# Patient Record
Sex: Female | Born: 1975 | Race: Black or African American | Hispanic: No | Marital: Single | Smoking: Never smoker
Health system: Southern US, Community
[De-identification: ages and names within clinical notes are randomized; demographics above are authoritative.]

## PROBLEM LIST (undated history)

## (undated) DIAGNOSIS — J40 Bronchitis, not specified as acute or chronic: Secondary | ICD-10-CM

## (undated) DIAGNOSIS — T7840XA Allergy, unspecified, initial encounter: Secondary | ICD-10-CM

## (undated) HISTORY — DX: Allergy, unspecified, initial encounter: T78.40XA

---

## 2006-06-05 ENCOUNTER — Emergency Department (HOSPITAL_COMMUNITY): Admission: EM | Admit: 2006-06-05 | Discharge: 2006-06-05 | Payer: Self-pay | Admitting: Emergency Medicine

## 2006-06-23 ENCOUNTER — Emergency Department (HOSPITAL_COMMUNITY): Admission: EM | Admit: 2006-06-23 | Discharge: 2006-06-23 | Payer: Self-pay | Admitting: Emergency Medicine

## 2006-07-08 ENCOUNTER — Emergency Department (HOSPITAL_COMMUNITY): Admission: EM | Admit: 2006-07-08 | Discharge: 2006-07-08 | Payer: Self-pay | Admitting: Emergency Medicine

## 2008-02-20 ENCOUNTER — Emergency Department (HOSPITAL_COMMUNITY): Admission: EM | Admit: 2008-02-20 | Discharge: 2008-02-20 | Payer: Self-pay | Admitting: Emergency Medicine

## 2010-02-15 ENCOUNTER — Emergency Department (HOSPITAL_COMMUNITY): Admission: EM | Admit: 2010-02-15 | Discharge: 2010-02-15 | Payer: Self-pay | Admitting: Gastroenterology

## 2011-05-20 ENCOUNTER — Emergency Department (HOSPITAL_COMMUNITY)
Admission: EM | Admit: 2011-05-20 | Discharge: 2011-05-20 | Disposition: A | Discharge status: Home / Self Care | Payer: Medicaid Other | Attending: Emergency Medicine | Admitting: Emergency Medicine

## 2011-05-20 ENCOUNTER — Encounter: Payer: Self-pay | Admitting: *Deleted

## 2011-05-20 DIAGNOSIS — J020 Streptococcal pharyngitis: Secondary | ICD-10-CM | POA: Insufficient documentation

## 2011-05-20 DIAGNOSIS — J45909 Unspecified asthma, uncomplicated: Secondary | ICD-10-CM | POA: Insufficient documentation

## 2011-05-20 LAB — RAPID STREP SCREEN (MED CTR MEBANE ONLY): Streptococcus, Group A Screen (Direct): POSITIVE — AB

## 2011-05-20 MED ORDER — HYDROCODONE-ACETAMINOPHEN 5-325 MG PO TABS
1.0000 | ORAL_TABLET | Freq: Once | ORAL | Status: AC
  Administered 2011-05-20: 1 via ORAL
  Filled 2011-05-20: qty 1

## 2011-05-20 MED ORDER — PENICILLIN V POTASSIUM 500 MG PO TABS: 500.0000 mg | ORAL_TABLET | Freq: Four times a day (QID) | ORAL | Status: AC

## 2011-05-20 MED ORDER — PENICILLIN V POTASSIUM 250 MG PO TABS
500.0000 mg | ORAL_TABLET | Freq: Once | ORAL | Status: AC
  Administered 2011-05-20: 500 mg via ORAL
  Filled 2011-05-20: qty 2

## 2011-05-20 MED ORDER — HYDROCODONE-ACETAMINOPHEN 5-325 MG PO TABS: 1.0000 | ORAL_TABLET | ORAL | Status: AC | PRN

## 2011-05-20 NOTE — ED Provider Notes (Signed)
History   This chart was scribed for [employer]. Colon Branch, MD by Sofie Rower. The patient was seen in room APA06/APA06 and the patient's care was started at 8:21AM.  CSN: [id number] Arrival date & time: 05/20/2011  8:03 AM   First MD Initiated Contact with Patient 05/20/11 [phone]      Chief Complaint  Patient presents with  . Sore Throat    (Consider location/radiation/quality/duration/timing/severity/associated sxs/prior treatment) HPI  Linda Carroll is a 35 y.o. female who presents to the Emergency Department complaining of moderate, constant sore throat onset this morning with associated symptoms of loss of appetite, dysphagia. Pt. Denies chills, vomiting. Pt. Is not a smoker. Pt. Denies denies recent sick contacts.    Past Medical History  Diagnosis Date  . Asthma     History reviewed. No pertinent past surgical history.  History reviewed. No pertinent family history.  History  Substance Use Topics  . Smoking status: Never Smoker   . Smokeless tobacco: Not on file  . Alcohol Use: No    OB History    Grav Para Term Preterm Abortions TAB SAB Ect Mult Living                  Review of Systems  10 Systems reviewed and are negative for acute change except as noted in the HPI.  Allergies  Review of patient's allergies indicates no known allergies.  Home Medications   Current Outpatient Rx  Name Route Sig Dispense Refill  . ALBUTEROL SULFATE HFA 108 (90 BASE) MCG/ACT IN AERS Inhalation Inhale 2 puffs into the lungs every 6 (six) hours as needed. Shortness of breath     . FLUTICASONE-SALMETEROL 250-50 MCG/DOSE IN AEPB Inhalation Inhale 1 puff into the lungs every 12 (twelve) hours as needed. Shortness of breath       BP 135/76  Pulse 95  Temp(Src) 98.7 F (37.1 C) (Oral)  Resp 18  Ht 5\' 3"  (1.6 m)  Wt 165 lb (74.844 kg)  BMI 29.23 kg/m2  SpO2 100%  LMP 05/16/2011  Physical Exam  Nursing note and vitals reviewed. Constitutional: She is oriented to person, place,  and time. She appears well-developed and well-nourished. No distress.  HENT:  Head: Normocephalic and atraumatic.  Right Ear: Tympanic membrane and external ear normal.  Left Ear: Tympanic membrane and external ear normal.  Mouth/Throat: Oropharynx is clear and moist. No oropharyngeal exudate.       Slightly erythematous tonsils. Slightly enlarged tonsils. No exudate.   Eyes: EOM are normal. Pupils are equal, round, and reactive to light.  Neck: Normal range of motion. Neck supple. No tracheal deviation present.  Cardiovascular: Normal rate, regular rhythm and normal heart sounds.  Exam reveals no gallop and no friction rub.   No murmur heard. Pulmonary/Chest: Effort normal and breath sounds normal. No respiratory distress. She has no wheezes. She has no rales.  Abdominal: Soft. She exhibits no distension. There is no tenderness.  Musculoskeletal: Normal range of motion. She exhibits no edema.  Lymphadenopathy:    She has no cervical adenopathy.  Neurological: She is alert and oriented to person, place, and time. No sensory deficit.  Skin: Skin is warm and dry.  Psychiatric: She has a normal mood and affect. Her behavior is normal.    ED Course  Procedures (including critical care time)  Labs Reviewed  RAPID STREP SCREEN - Abnormal; Notable for the following:    Streptococcus, Group A Screen (Direct) POSITIVE (*)    All other components  within normal limits   No results found.   No diagnosis found.  DIAGNOSTIC STUDIES: Oxygen Saturation is 100% on room air, normal by my interpretation.    COORDINATION OF CARE: 8:28AM- EDP at bedside discusses treatment plan. 9:31AM-Recheck. EDP discusses treatment plan for strep throat and discharge.   Results for orders placed during the hospital encounter of 05/20/11  RAPID STREP SCREEN      Component Value Range   Streptococcus, Group A Screen (Direct) POSITIVE (*) NEGATIVE      MDM  Patient with sore throat that began this  morning. Strep positive. Initiated antibiotic treatment.Pt stable in ED with no significant deterioration in condition.The patient appears reasonably screened and/or stabilized for discharge and I doubt any other medical condition or other [facility] requiring further screening, evaluation, or treatment in the ED at this time prior to discharge.   I personally performed the services described in this documentation, which was scribed in my presence. The recorded information has been reviewed and considered.  MDM Reviewed: nursing note and vitals Interpretation: labs     Nicoletta Dress. Colon Branch, MD 05/20/11 [phone]

## 2011-05-20 NOTE — ED Notes (Signed)
Pt c/o sore throat and states she can't eat or sleep d/t pain. Pt describes pain as sharp.

## 2011-05-20 NOTE — ED Notes (Signed)
Pt c/o cough and sore throat. nad noted.

## 2013-03-12 ENCOUNTER — Encounter (HOSPITAL_COMMUNITY): Payer: Self-pay | Admitting: Emergency Medicine

## 2013-03-12 ENCOUNTER — Emergency Department (HOSPITAL_COMMUNITY)
Admission: EM | Admit: 2013-03-12 | Discharge: 2013-03-12 | Disposition: A | Discharge status: Home / Self Care | Payer: Medicaid Other | Attending: Emergency Medicine | Admitting: Emergency Medicine

## 2013-03-12 DIAGNOSIS — J45901 Unspecified asthma with (acute) exacerbation: Secondary | ICD-10-CM | POA: Insufficient documentation

## 2013-03-12 MED ORDER — ALBUTEROL SULFATE HFA 108 (90 BASE) MCG/ACT IN AERS: 4.0000 | INHALATION_SPRAY | RESPIRATORY_TRACT | Status: DC | PRN

## 2013-03-12 MED ORDER — ALBUTEROL SULFATE HFA 108 (90 BASE) MCG/ACT IN AERS
4.0000 | INHALATION_SPRAY | RESPIRATORY_TRACT | Status: DC | PRN
  Administered 2013-03-12: 4 via RESPIRATORY_TRACT
  Filled 2013-03-12: qty 6.7

## 2013-03-12 MED ORDER — ALBUTEROL SULFATE (5 MG/ML) 0.5% IN NEBU
2.5000 mg | INHALATION_SOLUTION | RESPIRATORY_TRACT | Status: DC
  Administered 2013-03-12: 2.5 mg via RESPIRATORY_TRACT
  Filled 2013-03-12: qty 0.5

## 2013-03-12 MED ORDER — DEXAMETHASONE 6 MG PO TABS
10.0000 mg | ORAL_TABLET | Freq: Once | ORAL | Status: DC
  Filled 2013-03-12: qty 1

## 2013-03-12 MED ORDER — IPRATROPIUM BROMIDE 0.02 % IN SOLN
0.5000 mg | RESPIRATORY_TRACT | Status: DC
  Administered 2013-03-12: 0.5 mg via RESPIRATORY_TRACT
  Filled 2013-03-12: qty 2.5

## 2013-03-12 MED ORDER — DEXAMETHASONE SODIUM PHOSPHATE 4 MG/ML IJ SOLN
10.0000 mg | Freq: Once | INTRAMUSCULAR | Status: AC
  Administered 2013-03-12: 10 mg via INTRAMUSCULAR
  Filled 2013-03-12: qty 3

## 2013-03-12 NOTE — ED Notes (Signed)
Patient c/o increased wheezing and shortness of breath secondary to asthma.  States has run out of her MDI.

## 2013-03-12 NOTE — ED Provider Notes (Signed)
CSN: [id number]     Arrival date & time 03/12/13  2046 History   First MD Initiated Contact with Patient 03/12/13 2053     Chief Complaint  Patient presents with  . Asthma  . Wheezing   (Consider location/radiation/quality/duration/timing/severity/associated sxs/prior Treatment) Patient is a 37 y.o. female presenting with asthma and wheezing.  Asthma Associated symptoms include shortness of breath. Pertinent negatives include no chest pain.  Wheezing Associated symptoms: cough and shortness of breath   Associated symptoms: no chest pain, no chest tightness and no fever    This is a 37 yo female with history of asthma who presents with wheezing. Patient reports onset of symptoms tonight. She states that weather triggers her asthma.  She has run out of her inhaler. She is unsure when her last exacerbation was. She's never had to be hospitalized or intubated for her asthma. She denies any fevers. Past Medical History  Diagnosis Date  . Asthma    History reviewed. No pertinent past surgical history. No family history on file. History  Substance Use Topics  . Smoking status: Never Smoker   . Smokeless tobacco: Not on file  . Alcohol Use: No   OB History   Grav Para Term Preterm Abortions TAB SAB Ect Mult Living                 Review of Systems  Constitutional: Negative for fever.  Respiratory: Positive for cough, shortness of breath and wheezing. Negative for chest tightness.   Cardiovascular: Negative for chest pain.    Allergies  Review of patient's allergies indicates no known allergies.  Home Medications   Current Outpatient Rx  Name  Route  Sig  Dispense  Refill  . albuterol (PROVENTIL HFA;VENTOLIN HFA) 108 (90 BASE) MCG/ACT inhaler   Inhalation   Inhale 4 puffs into the lungs every 4 (four) hours as needed for wheezing or shortness of breath.   1 Inhaler   0    BP 104/50  Pulse 89  Temp(Src) 99.2 F (37.3 C) (Oral)  Resp 18  Wt 165 lb (74.844 kg)  BMI  29.24 kg/m2  SpO2 100%  LMP 03/05/2013 Physical Exam  Nursing note and vitals reviewed. Constitutional: She is oriented to person, place, and time. She appears well-developed and well-nourished. No distress.  HENT:  Head: Normocephalic and atraumatic.  Eyes: Pupils are equal, round, and reactive to light.  Neck: Neck supple.  Cardiovascular: Normal rate, regular rhythm and normal heart sounds.   Pulmonary/Chest: No respiratory distress. She has wheezes.  Patient speaking in short sentences. Increased respiratory rate, diffuse expiratory wheezing  Abdominal: Soft. There is no tenderness.  Neurological: She is alert and oriented to person, place, and time.  Skin: Skin is warm and dry.  Psychiatric: She has a normal mood and affect.    ED Course  Procedures (including critical care time) Labs Review Labs Reviewed - No data to display Imaging Review No results found.  MDM   1. Asthma exacerbation    Patient presents with acute asthma exacerbation.  Nontoxic but tachypnic.  100% O2 sats.  Given decadron, neb and HFA with improvement but still intermittent wheezing.  Patient to be reassessed by Dr. Manus Gunning prior to d/c.    Shon Baton, MD 03/13/13 [phone]

## 2013-05-16 ENCOUNTER — Encounter (HOSPITAL_COMMUNITY): Payer: Self-pay | Admitting: Emergency Medicine

## 2013-05-16 ENCOUNTER — Emergency Department (HOSPITAL_COMMUNITY)
Admission: EM | Admit: 2013-05-16 | Discharge: 2013-05-16 | Disposition: A | Discharge status: Home / Self Care | Payer: Medicaid Other | Attending: Emergency Medicine | Admitting: Emergency Medicine

## 2013-05-16 ENCOUNTER — Emergency Department (HOSPITAL_COMMUNITY): Payer: Medicaid Other

## 2013-05-16 DIAGNOSIS — Z3202 Encounter for pregnancy test, result negative: Secondary | ICD-10-CM | POA: Insufficient documentation

## 2013-05-16 DIAGNOSIS — J159 Unspecified bacterial pneumonia: Secondary | ICD-10-CM | POA: Insufficient documentation

## 2013-05-16 DIAGNOSIS — Z79899 Other long term (current) drug therapy: Secondary | ICD-10-CM | POA: Insufficient documentation

## 2013-05-16 DIAGNOSIS — J189 Pneumonia, unspecified organism: Secondary | ICD-10-CM

## 2013-05-16 HISTORY — DX: Bronchitis, not specified as acute or chronic: J40

## 2013-05-16 MED ORDER — HYDROCOD POLST-CHLORPHEN POLST 10-8 MG/5ML PO LQCR
5.0000 mL | Freq: Once | ORAL | Status: AC
  Administered 2013-05-16: 5 mL via ORAL
  Filled 2013-05-16: qty 5

## 2013-05-16 MED ORDER — ALBUTEROL SULFATE HFA 108 (90 BASE) MCG/ACT IN AERS
2.0000 | INHALATION_SPRAY | Freq: Once | RESPIRATORY_TRACT | Status: AC
  Administered 2013-05-16: 2 via RESPIRATORY_TRACT
  Filled 2013-05-16: qty 6.7

## 2013-05-16 MED ORDER — PREDNISONE 50 MG PO TABS
60.0000 mg | ORAL_TABLET | Freq: Once | ORAL | Status: AC
  Administered 2013-05-16: 60 mg via ORAL
  Filled 2013-05-16 (×2): qty 1

## 2013-05-16 MED ORDER — LIDOCAINE HCL (PF) 1 % IJ SOLN
INTRAMUSCULAR | Status: AC
  Administered 2013-05-16: 2.1 mL
  Filled 2013-05-16: qty 5

## 2013-05-16 MED ORDER — AZITHROMYCIN 250 MG PO TABS
500.0000 mg | ORAL_TABLET | Freq: Once | ORAL | Status: AC
  Administered 2013-05-16: 500 mg via ORAL
  Filled 2013-05-16: qty 2

## 2013-05-16 MED ORDER — SODIUM CHLORIDE 0.9 % IN NEBU
INHALATION_SOLUTION | RESPIRATORY_TRACT | Status: AC
  Administered 2013-05-16: 18:00:00
  Filled 2013-05-16: qty 3

## 2013-05-16 MED ORDER — PHENYLEPH-DIPHENHYD-CODEINE 7.5-10-7.5 MG/5ML PO SYRP: ORAL_SOLUTION | ORAL | Status: DC

## 2013-05-16 MED ORDER — ALBUTEROL SULFATE (5 MG/ML) 0.5% IN NEBU
2.5000 mg | INHALATION_SOLUTION | Freq: Once | RESPIRATORY_TRACT | Status: AC
  Administered 2013-05-16: 2.5 mg via RESPIRATORY_TRACT
  Filled 2013-05-16: qty 0.5

## 2013-05-16 MED ORDER — ALBUTEROL SULFATE (5 MG/ML) 0.5% IN NEBU
5.0000 mg | INHALATION_SOLUTION | Freq: Once | RESPIRATORY_TRACT | Status: AC
  Administered 2013-05-16: 5 mg via RESPIRATORY_TRACT
  Filled 2013-05-16: qty 1

## 2013-05-16 MED ORDER — AZITHROMYCIN 250 MG PO TABS: ORAL_TABLET | ORAL | Status: DC

## 2013-05-16 MED ORDER — CEFTRIAXONE SODIUM 1 G IJ SOLR
1.0000 g | Freq: Once | INTRAMUSCULAR | Status: AC
  Administered 2013-05-16: 1 g via INTRAMUSCULAR
  Filled 2013-05-16: qty 10

## 2013-05-16 MED ORDER — PREDNISONE 10 MG PO TABS: ORAL_TABLET | ORAL | Status: DC

## 2013-05-16 NOTE — ED Notes (Signed)
Coughing and sob starting today.  Reports hx of asthma and ran out of inhaler.

## 2013-05-16 NOTE — ED Notes (Signed)
Pt seen and evaluated by EDPa for initial assessment. 

## 2013-05-16 NOTE — ED Provider Notes (Signed)
CSN: [id number]     Arrival date & time 05/16/13  1724 History   First MD Initiated Contact with Patient 05/16/13 1742     Chief Complaint  Patient presents with  . Asthma   (Consider location/radiation/quality/duration/timing/severity/associated sxs/prior Treatment) HPI Comments: Patient states that late last night and early this morning she began having problems with cough and congestion. The symptoms were accompanied by shortness of breath. The patient states she has a history of asthma, she tried her albuterol inhaler, but this ran out in the middle of night. She states the cough congestion and shortness of breath has gotten progressively worse during the day. She came to the emergency department because she says that walking even short distances causes her more shortness of breath, and the coughing has gotten more severe. There's been no high fevers reported. No hemoptysis. No injury to the chest. No history of operations or procedures on the lungs. The patient does not smoke.  Patient is a 37 y.o. female presenting with shortness of breath. The history is provided by the patient.  Shortness of Breath Severity:  Moderate Onset quality:  Gradual Duration:  1 day Timing:  Constant Progression:  Worsening Context: URI and weather changes   Relieved by:  Nothing Worsened by:  Nothing tried Ineffective treatments:  Inhaler Associated symptoms: cough and wheezing   Associated symptoms: no abdominal pain, no chest pain, no hemoptysis, no neck pain and no vomiting   Risk factors: no recent alcohol use and no tobacco use     Past Medical History  Diagnosis Date  . Asthma   . Bronchitis    History reviewed. No pertinent past surgical history. No family history on file. History  Substance Use Topics  . Smoking status: Never Smoker   . Smokeless tobacco: Not on file  . Alcohol Use: No   OB History   Grav Para Term Preterm Abortions TAB SAB Ect Mult Living                 Review  of Systems  Constitutional: Negative for activity change.       All ROS Neg except as noted in HPI  HENT: Positive for congestion. Negative for nosebleeds.   Eyes: Negative for photophobia and discharge.  Respiratory: Positive for cough and wheezing. Negative for hemoptysis and shortness of breath.   Cardiovascular: Negative for chest pain and palpitations.  Gastrointestinal: Negative for vomiting, abdominal pain and blood in stool.  Genitourinary: Negative for dysuria, frequency and hematuria.  Musculoskeletal: Negative for arthralgias, back pain and neck pain.  Skin: Negative.   Neurological: Negative for dizziness, seizures and speech difficulty.  Psychiatric/Behavioral: Negative for hallucinations and confusion.    Allergies  Peanuts  Home Medications   Current Outpatient Rx  Name  Route  Sig  Dispense  Refill  . albuterol (PROVENTIL HFA;VENTOLIN HFA) 108 (90 BASE) MCG/ACT inhaler   Inhalation   Inhale 4 puffs into the lungs every 4 (four) hours as needed for wheezing or shortness of breath.   1 Inhaler   0    BP 150/88  Pulse 99  Temp(Src) 98.8 F (37.1 C) (Oral)  Resp 18  Ht 5\' 4"  (1.626 m)  SpO2 100%  LMP 03/16/2013 Physical Exam  Nursing note and vitals reviewed. Constitutional: She is oriented to person, place, and time. She appears well-developed and well-nourished.  Non-toxic appearance.  HENT:  Head: Normocephalic.  Right Ear: Tympanic membrane and external ear normal.  Left Ear: Tympanic membrane and external  ear normal.  Eyes: EOM and lids are normal. Pupils are equal, round, and reactive to light.  Neck: Normal range of motion. Neck supple. Carotid bruit is not present.  Cardiovascular: Normal rate, regular rhythm, normal heart sounds, intact distal pulses and normal pulses.   Pulmonary/Chest: Breath sounds normal. No respiratory distress.  There are bilateral rhonchi present. There are coarse expiratory wheezes present. The patient occasionally uses  sensory muscles for breathing. Speech is rushed.  Abdominal: Soft. Bowel sounds are normal. There is no tenderness. There is no guarding.  Musculoskeletal: Normal range of motion.  Lymphadenopathy:       Head (right side): No submandibular adenopathy present.       Head (left side): No submandibular adenopathy present.    She has no cervical adenopathy.  Neurological: She is alert and oriented to person, place, and time. She has normal strength. No cranial nerve deficit or sensory deficit.  Skin: Skin is warm and dry.  Psychiatric: She has a normal mood and affect. Her speech is normal.    ED Course  Procedures (including critical care time) Labs Review Labs Reviewed - No data to display Imaging Review No results found.  EKG Interpretation   None       MDM  No diagnosis found. *I have reviewed nursing notes, vital signs, and all appropriate lab and imaging results for this patient.**  Chest x-ray reveals medial right lung base pneumonia.  After 2 nebulizer treatments, the patient is now speaking in complete sentences. She is no longer using accessory muscles for assistance with breathing. Pulse oximetries remained stable.  The patient is treated with oral Zithromax, and intramuscular Rocephin in the emergency department. Prescription for Zithromax, prednisone, and Endal cough medication given to the patient. Patient given an inhaler here in the emergency department. Patient is to see her primary physician in 4-5 days for recheck and evaluation of her pneumonia.  Kathie Dike, PA-C 05/16/13 [phone]

## 2013-05-17 NOTE — ED Notes (Signed)
Pharmacy did not have cough rx med per [facility]. rx cough med changed per dr Rufina Falco to PromethazineVC codeine

## 2013-05-17 NOTE — ED Provider Notes (Signed)
Medical screening examination/treatment/procedure(s) were performed by non-physician practitioner and as supervising physician I was immediately available for consultation/collaboration.  EKG Interpretation   None        Donnetta Hutching, MD 05/17/13 0005

## 2013-06-07 ENCOUNTER — Emergency Department (HOSPITAL_COMMUNITY): Payer: Medicaid Other

## 2013-06-07 ENCOUNTER — Emergency Department (HOSPITAL_COMMUNITY)
Admission: EM | Admit: 2013-06-07 | Discharge: 2013-06-07 | Disposition: A | Discharge status: Home / Self Care | Payer: Medicaid Other | Attending: Emergency Medicine | Admitting: Emergency Medicine

## 2013-06-07 DIAGNOSIS — J45901 Unspecified asthma with (acute) exacerbation: Secondary | ICD-10-CM | POA: Insufficient documentation

## 2013-06-07 DIAGNOSIS — Z79899 Other long term (current) drug therapy: Secondary | ICD-10-CM | POA: Insufficient documentation

## 2013-06-07 MED ORDER — ALBUTEROL SULFATE HFA 108 (90 BASE) MCG/ACT IN AERS
2.0000 | INHALATION_SPRAY | RESPIRATORY_TRACT | Status: AC
  Administered 2013-06-07: 2 via RESPIRATORY_TRACT
  Filled 2013-06-07: qty 6.7

## 2013-06-07 MED ORDER — ALBUTEROL SULFATE (5 MG/ML) 0.5% IN NEBU
5.0000 mg | INHALATION_SOLUTION | Freq: Once | RESPIRATORY_TRACT | Status: AC
  Administered 2013-06-07: 5 mg via RESPIRATORY_TRACT
  Filled 2013-06-07: qty 1

## 2013-06-07 MED ORDER — PREDNISONE 50 MG PO TABS
60.0000 mg | ORAL_TABLET | Freq: Once | ORAL | Status: AC
  Administered 2013-06-07: 60 mg via ORAL
  Filled 2013-06-07 (×2): qty 1

## 2013-06-07 MED ORDER — PREDNISONE 20 MG PO TABS: 40.0000 mg | ORAL_TABLET | Freq: Every day | ORAL | Status: DC

## 2013-06-07 MED ORDER — BENZONATATE 100 MG PO CAPS: 100.0000 mg | ORAL_CAPSULE | Freq: Three times a day (TID) | ORAL | Status: DC | PRN

## 2013-06-07 MED ORDER — IPRATROPIUM BROMIDE 0.02 % IN SOLN
0.5000 mg | Freq: Once | RESPIRATORY_TRACT | Status: DC
  Filled 2013-06-07: qty 2.5

## 2013-06-07 NOTE — ED Notes (Signed)
Patient reports: -cough and shortness of breath starting this AM -she has a HX of asthma

## 2013-06-07 NOTE — ED Provider Notes (Signed)
CSN: [id number]     Arrival date & time 06/07/13  1051 History   First MD Initiated Contact with Patient 06/07/13 1313     Chief Complaint  Patient presents with  . Shortness of Breath  . Cough    HPI Pt was seen at 1405.  Per pt, c/o gradual onset and worsening of persistent cough, wheezing and SOB since this morning.  Describes her symptoms as "my asthma is acting up."  Has been using home MDI with transient relief.  States she "ran out" of her MDI this morning after using it. Denies CP/palpitations, no back pain, no abd pain, no N/V/D, no fevers, no rash.     Past Medical History  Diagnosis Date  . Asthma   . Bronchitis    No past surgical history on file.  History  Substance Use Topics  . Smoking status: Never Smoker   . Smokeless tobacco: Not on file  . Alcohol Use: No    Review of Systems ROS: Statement: All systems negative except as marked or noted in the HPI; Constitutional: Negative for fever and chills. ; ; Eyes: Negative for eye pain, redness and discharge. ; ; ENMT: Negative for ear pain, hoarseness, nasal congestion, sinus pressure and sore throat. ; ; Cardiovascular: Negative for chest pain, palpitations, diaphoresis and peripheral edema. ; ; Respiratory: +cough, SOB, wheezing. Negative for stridor. ; ; Gastrointestinal: Negative for nausea, vomiting, diarrhea, abdominal pain, blood in stool, hematemesis, jaundice and rectal bleeding. . ; ; Genitourinary: Negative for dysuria, flank pain and hematuria. ; ; Musculoskeletal: Negative for back pain and neck pain. Negative for swelling and trauma.; ; Skin: Negative for pruritus, rash, abrasions, blisters, bruising and skin lesion.; ; Neuro: Negative for headache, lightheadedness and neck stiffness. Negative for weakness, altered level of consciousness , altered mental status, extremity weakness, paresthesias, involuntary movement, seizure and syncope.       Allergies  Peanuts  Home Medications   Current Outpatient Rx   Name  Route  Sig  Dispense  Refill  . albuterol (PROVENTIL HFA;VENTOLIN HFA) 108 (90 BASE) MCG/ACT inhaler   Inhalation   Inhale 4 puffs into the lungs every 4 (four) hours as needed for wheezing or shortness of breath.   1 Inhaler   0    BP 129/78  Pulse 19  Temp(Src) 98.1 F (36.7 C) (Oral)  Resp 19  Ht 5\' 2"  (1.575 m)  Wt 150 lb (68.04 kg)  BMI 27.43 kg/m2  SpO2 96%  LMP 06/06/2013 Physical Exam 1410: Physical examination:  Nursing notes reviewed; Vital signs and O2 SAT reviewed;  Constitutional: Well developed, Well nourished, Well hydrated, In no acute distress; Head:  Normocephalic, atraumatic; Eyes: EOMI, PERRL, No scleral icterus; ENMT: TM's clear bilat. +edemetous nasal turbinates bilat with clear rhinorrhea. Mouth and pharynx without lesions. No tonsillar exudates. No intra-oral edema. No submandibular or sublingual edema. No hoarse voice, no drooling, no stridor. No pain with manipulation of larynx. Mouth and pharynx normal, Mucous membranes moist; Neck: Supple, Full range of motion, No lymphadenopathy; Cardiovascular: Regular rate and rhythm, No gallop; Respiratory: Breath sounds clear & equal bilaterally, faint occasional scattered wheeze. No audible wheezing.  Speaking full sentences with ease, Normal respiratory effort/excursion; Chest: Nontender, Movement normal; Abdomen: Soft, Nontender, Nondistended, Normal bowel sounds; Genitourinary: No CVA tenderness; Extremities: Pulses normal, No tenderness, No edema, No calf edema or asymmetry.; Neuro: AA&Ox3, Major CN grossly intact.  Speech clear. No gross focal motor or sensory deficits in extremities.; Skin: Color normal, Warm,  Dry.   ED Course  Procedures    EKG Interpretation   None       MDM  MDM Reviewed: previous chart, nursing note and vitals Interpretation: x-ray     Dg Chest 2 View 06/07/2013   CLINICAL DATA:  Cough and congestion with recent history of pneumonia  EXAM: CHEST  2 VIEW  COMPARISON:  Chest  x-ray of May 16, 2013  FINDINGS: There were made coarse lung markings in the right infrahilar region but there has been considerable improvement since the previous study. No discrete infiltrate is demonstrated. The cardiopericardial silhouette is normal in size. The pulmonary vascularity is not engorged. The mediastinum is normal in width. There is no pleural effusion. The observed portions of the bony thorax appear normal.  IMPRESSION: Minimally increased lung markings on the frontal film only in the right infrahilar region at the site of previous infiltrate are noted. There is no definite infiltrate today here or elsewhere. Otherwise there is no evidence of acute cardiopulmonary abnormality either.   Electronically Signed   By: David  [place]   On: 06/07/2013 11:51     1520:  Pt states she "feels better" after neb and steroid.  NAD, lungs CTA bilat, no wheezing, resps easy, speaking full sentences, Sats 100% R/A.  Pt wants to go home now. Will tx for asthma exacerbation. MDI teach/treat here. Dx and testing d/w pt.  Questions answered.  Verb understanding, agreeable to d/c home with outpt f/u.    Laray Anger, DO 06/09/13 1644

## 2014-03-28 ENCOUNTER — Encounter (HOSPITAL_COMMUNITY): Payer: Self-pay | Admitting: Emergency Medicine

## 2014-03-28 ENCOUNTER — Emergency Department (HOSPITAL_COMMUNITY)
Admission: EM | Admit: 2014-03-28 | Discharge: 2014-03-28 | Disposition: A | Discharge status: Home / Self Care | Payer: Medicaid Other | Attending: Emergency Medicine | Admitting: Emergency Medicine

## 2014-03-28 DIAGNOSIS — J45901 Unspecified asthma with (acute) exacerbation: Secondary | ICD-10-CM | POA: Diagnosis not present

## 2014-03-28 DIAGNOSIS — Z79899 Other long term (current) drug therapy: Secondary | ICD-10-CM | POA: Insufficient documentation

## 2014-03-28 DIAGNOSIS — Z7952 Long term (current) use of systemic steroids: Secondary | ICD-10-CM | POA: Diagnosis not present

## 2014-03-28 DIAGNOSIS — R0602 Shortness of breath: Secondary | ICD-10-CM | POA: Diagnosis present

## 2014-03-28 MED ORDER — PREDNISONE 20 MG PO TABS: 40.0000 mg | ORAL_TABLET | Freq: Every day | ORAL | Status: DC

## 2014-03-28 MED ORDER — PREDNISONE 50 MG PO TABS
60.0000 mg | ORAL_TABLET | Freq: Once | ORAL | Status: AC
  Administered 2014-03-28: 60 mg via ORAL
  Filled 2014-03-28 (×2): qty 1

## 2014-03-28 MED ORDER — ALBUTEROL SULFATE HFA 108 (90 BASE) MCG/ACT IN AERS
2.0000 | INHALATION_SPRAY | RESPIRATORY_TRACT | Status: AC
  Administered 2014-03-28: 2 via RESPIRATORY_TRACT
  Filled 2014-03-28: qty 6.7

## 2014-03-28 NOTE — Discharge Instructions (Signed)
°Emergency Department Resource Guide °1) Find a Doctor and Pay Out of Pocket °Although you won't have to find out who is covered by your insurance plan, it is a good idea to ask around and get recommendations. You will then need to call the office and see if the doctor you have chosen will accept you as a new patient and what types of options they offer for patients who are self-pay. Some doctors offer discounts or will set up payment plans for their patients who do not have insurance, but you will need to ask so you aren't surprised when you get to your appointment. ° °2) Contact Your Local Health Department °Not all health departments have doctors that can see patients for sick visits, but many do, so it is worth a call to see if yours does. If you don't know where your local health department is, you can check in your phone book. The CDC also has a tool to help you locate your state's health department, and many state websites also have listings of all of their local health departments. ° °3) Find a Walk-in Clinic °If your illness is not likely to be very severe or complicated, you may want to try a walk in clinic. These are popping up all over the country in pharmacies, drugstores, and shopping centers. They're usually staffed by nurse practitioners or physician assistants that have been trained to treat common illnesses and complaints. They're usually fairly quick and inexpensive. However, if you have serious medical issues or chronic medical problems, these are probably not your best option. ° °No Primary Care Doctor: °- Call [facility] at  832-8000 - they can help you locate a primary care doctor that  accepts your insurance, provides certain services, etc. °- Physician Referral Service- 1-800-533-3463 ° °Chronic Pain Problems: °Organization         Address  Phone   Notes  °[facility]  (336) 297-2271 Patients need to be referred by their primary care doctor.  ° °Medication  Assistance: °Organization         Address  Phone   Notes  °[place] [facility] [address] °[place], [place] [address] (336) 641-8030 --Must be a resident of [place] °-- Must have NO insurance coverage whatsoever (no Medicaid/ Medicare, etc.) °-- The pt. MUST have a primary care doctor that directs their care regularly and follows them in the community °  °[facility]  (866) 331-1348   °[employer]  (888) 892-1162   ° °Agencies that provide inexpensive medical care: °Organization         Address  Phone   Notes  °[facility]  (336) 832-8035   °[facility]    (336) 832-7272   °[facility] [address] °[place], [place] [address] (336) 832-4777   °[facility] [address], °[place] (336) 271-4999   °[facility]    (336) 373-0678   °[facility]    (336) 272-1050   °[facility] ° [address], [place] Phone:  (336) 832-4444, Fax:  (336) 832-4440 Hours of Operation:  9 am - 6 pm, M-F.  Also accepts Medicaid/Medicare and self-pay.  °[facility] ° [address], [place] Phone: (336) 832-3150, Fax: (336) 832-3151. Hours of Operation:  8:30 am - 5:30 pm, M-F.  Also accepts Medicaid and self-pay.  °[facility] [place] [address], [place] Phone: (336) 878-6027   °[facility] [address], [place], [place] (336)723-1848, Ext. 123 Mondays & Thursdays: 7-9 AM.  First 15 patients are seen on a first come, first serve basis. °  ° °Medicaid-accepting [place] Providers: ° °Organization         Address  Phone   Notes  °[facility] [address], [place] (336) 641-2100 Also accepts self-pay patients.  °[facility] [address], [place] ° (336) 856-9996   °[facility] [address], [place]  (336) 288-8857   °[facility] [address] [place] [address], [place] (336) 299-7000   °Veita Bland [address], [place]  ° (336) 373-1557 Only accepts [place] Access Medicaid patients after they have their name applied to their card.  ° °Self-Pay (no insurance) in [place]: ° °Organization         Address  Phone   Notes  °Sickle Cell Patients, [facility] [address], [place] (336) 832-1970   °[facility] [address], [employer] (336) 832-4400   °[facility] ° [address], [place] (336) 992-4800   °[facility]/Dr. Osei-Bonsu ° [address] [place] [address], [place] or [address], [place] (336) 841-8500 Phone number for both [place] and [place] locations is the same.  °[facility] [address], [place] (336) 299-0000   °[facility] [address] [place] [address], [place] or [address] (336) 852-7530 °(336) 878-2260   °[facility] [address], [place] (336) 350-1642, phone; (336) 294-5005, fax Sees patients 1st and 3rd Saturday of every month.  Must not qualify for public or private insurance (i.e. Medicaid, Medicare, [place] Health Choice, Veterans' Benefits) • Household income should be no more than 200% of the poverty level •The clinic cannot treat you if you are pregnant or think you are pregnant • Sexually transmitted diseases are not treated at the clinic.  ° ° °Dental Care: °Organization         Address  Phone  Notes  °[place] [facility] [address], [place] (336) 641-6152 Accepts children up to age 21 who are enrolled in Medicaid or [place] Health Choice; pregnant women with a Medicaid card; and children who have applied for Medicaid or [place] Health Choice, but were declined, whose parents can pay a reduced fee at time of service.  °[place]  [facility] [place]  [address], [place] (336) 641-7733 Accepts children up to age 21 who are enrolled in Medicaid or [place] Health Choice; pregnant women with a Medicaid card; and children who have applied for Medicaid or [place] Health Choice, but were declined, whose parents can pay a reduced fee at time of service.  °[facility] ° [address], [place] (336) 641-4533 Patients are seen by appointment only. Walk-ins are not accepted. [facility] will see patients 18 years of age and older. °Monday - Tuesday (8am-5pm) °Most Wednesdays (8:30-5pm) °$30 per visit, cash only  °[facility] ° [address], [place] (336) 641-4533 Patients are seen by appointment only. Walk-ins are not accepted. [facility] will see patients 18 years of age and older. °One   Wednesday Evening (Monthly: Volunteer Based).  $30 per visit, cash only  °[facility]  (919) 537-3737 for adults; Children under age 4, call Graduate Pediatric Dentistry at (919) [phone]. Children aged 4-14, please call (919) [phone] to request a pediatric application. ° Dental services are provided in all areas of dental care including fillings, crowns and bridges, complete and partial dentures, implants, gum treatment, root canals, and extractions. Preventive care is also provided. Treatment is provided to both adults and children. °Patients are selected via a lottery and there is often a waiting list. °  °[facility] [address], °[place] ° (336) 763-8833 [email or web address] °  °[facility] [address], [place], [place] (336)723-1848, Ext. 123 Second and Fourth Thursday of each month, opens at 6:30 AM; Clinic ends at 9 AM.  Patients are seen on a first-come first-served basis, and a limited number are seen during each clinic.  ° °[facility] ° [address], [place], [place] (336) 723-7904    Eligibility Requirements °You must have lived in [place], [place], or [place] counties for at least the last three months. °  You cannot be eligible for state or federal sponsored healthcare insurance, including Veterans Administration, Medicaid, or Medicare. °  You generally cannot be eligible for healthcare insurance through your employer.  °  How to apply: °Eligibility screenings are held every Tuesday and Wednesday afternoon from 1:00 pm until 4:00 pm. You do not need an appointment for the interview!  °[facility] [address], [place], [place] 336-631-2330   °[facility]  336-342-8273   °[facility]  336-703-3100   °[facility]  336-570-6415   ° °Behavioral Health Resources in the Community: °Intensive Outpatient Programs °Organization         Address  Phone  Notes  °[facility] [address], [place], [place] 336-878-6098   °[facility] [address], [place], [place] 336-832-9800   °[facility] [address], [place], [place] ° 336-882-2125   °[facility] [address],  °[place], [place] 1-800-853-5163 or 336-641-4981   °Substance Abuse Resources °Organization         Address  Phone  Notes  °[facility]  336-882-2125   °[facility]  336-784-9470   °[facility]  336-285-9073   °[facility]  336-845-3988   °Residential & Outpatient Substance Abuse Program  1-800-659-3381   °Psychological Services °Organization         Address  Phone  Notes  °[facility]  336- 832-9600   °[facility]  336- 378-7881   °[facility] [address], [place] 1-800-853-5163 or 336-641-4981   ° °Mobile Crisis Teams °Organization         Address  Phone  Notes  °[facility], Mobile Crisis Care Unit  1-877-626-1772   °Assertive °[facility] ° [address]  [place], [place] 336-834-9664   °Sharon DeEsch [address] °[place] 336-554-5454   ° °Self-Help/Support Groups °Organization         Address  Phone             Notes  °[facility] - variety of support groups  336- 373-1402 Call for more information  °Narcotics Anonymous (NA), [facility] [address], °[place]  2 meetings at this location  ° °  Residential Treatment Programs °Organization         Address  Phone  Notes  °[facility] [address],    °[place]  1-866-801-8205   °[facility] ° [address], [place], [place] 704-293-8524   °[facility] [address], [place] 336-845-3988 Admissions: 8am-3pm M-F  °[facility] [address]    °[place], [place] 336-841-1104   °[facility] [address], [place], [place] 336-379-7146   °[facility] [address]  °[place], [place] 336-285-9073   °[facility] Intensive Outpatient [address], [place], [place] 336-852-3033   °[facility] [address]  °[place], [place] 1-877-615-2722 or 336-784-9470   °[facility] [address] , [place] 336-227-7417 Accepts Medicaid  °[facility] [address]  °[place] 1-800-659-3381 Substance Abuse/Addiction Treatment  ° °[place] Behavioral Health Resources °Organization         Address  Phone  Notes  °[facility]  (888) 581-9988   °Julie Brannon, PhD [address] [place], [place]   (336) 349-5553 or (336) 951-0000   °[facility]   [address] °[place], [place] (336) 349-4454   °[facility] [address], [place], [place] (336) 342-8316 Insurance/Medicaid/sponsorship through [facility]  °[facility] [address]                                    [place], [place] (336) 342-8316 Therapy/tele-psych/case    °[facility] [address]  ° [place],  (336) 349-2233    °Dr. Arfeen  (336) 349-4544   °[facility] [place]  [employer] [place] [facility] 1) [address], [place] °2) [address], [place] °3)  [address], [place] (336) 349-3220 °(336) 342-7768 ° °(336) 342-8140   °[place] [facility] (336) 342-1394 or (336) 342-3537 (After Hours)    ° ° °Take the prescription as directed.  Use your albuterol inhaler (2 to 4 puffs) or your albuterol nebulizer (1 unit dose) every 4 hours for the next 7 days, then as needed for cough, wheezing, or shortness of breath.  Call your regular medical doctor this morning to schedule a follow up appointment within the next 3 days.  Return to the Emergency Department immediately sooner if worsening.  ° °

## 2014-03-28 NOTE — ED Notes (Signed)
EMS reports pt woke up sob, used breathing treatment but is out of her inhaler.  Reports episode started feeling SOB after arguing with someone last night.  Pt reports this morning she could hardly get her daughter up and ready.  Pt tearful, says feels safe at home.    EMS administered 5mg  albuterol neb.

## 2014-03-28 NOTE — ED Provider Notes (Signed)
CSN: [id number]     Arrival date & time 03/28/14  7902 History   First MD Initiated Contact with Patient 03/28/14 [id number]     Chief Complaint  Patient presents with  . Shortness of Breath      HPI Pt was seen at 0715.  Per pt, c/o gradual onset and worsening of persistent cough, wheezing and SOB since last night. States her symptoms began "after I got in an argument with someone." Describes her symptoms as "my asthma is acting up."  States she ran out of her MDI. Pt states she did use a neb treatment at home before she called EMS. EMS gave a neb treatment en route. Pt states she feels "much better now."  Denies CP/palpitations, no back pain, no abd pain, no N/V/D, no fevers, no rash.     Past Medical History  Diagnosis Date  . Asthma   . Bronchitis    History reviewed. No pertinent past surgical history.  History  Substance Use Topics  . Smoking status: Never Smoker   . Smokeless tobacco: Not on file  . Alcohol Use: No    Review of Systems ROS: Statement: All systems negative except as marked or noted in the HPI; Constitutional: Negative for fever and chills. ; ; Eyes: Negative for eye pain, redness and discharge. ; ; ENMT: Negative for ear pain, hoarseness, nasal congestion, sinus pressure and sore throat. ; ; Cardiovascular: Negative for chest pain, palpitations, diaphoresis, and peripheral edema. ; ; Respiratory: +cough, wheezing, SOB. Negative for stridor. ; ; Gastrointestinal: Negative for nausea, vomiting, diarrhea, abdominal pain, blood in stool, hematemesis, jaundice and rectal bleeding. . ; ; Genitourinary: Negative for dysuria, flank pain and hematuria. ; ; Musculoskeletal: Negative for back pain and neck pain. Negative for swelling and trauma.; ; Skin: Negative for pruritus, rash, abrasions, blisters, bruising and skin lesion.; ; Neuro: Negative for headache, lightheadedness and neck stiffness. Negative for weakness, altered level of consciousness , altered mental status,  extremity weakness, paresthesias, involuntary movement, seizure and syncope.      Allergies  Peanuts  Home Medications   Prior to Admission medications   Medication Sig Start Date End Date Taking? Authorizing Provider  albuterol (PROVENTIL HFA;VENTOLIN HFA) 108 (90 BASE) MCG/ACT inhaler Inhale 4 puffs into the lungs every 4 (four) hours as needed for wheezing or shortness of breath. 03/12/13   Merryl Hacker, MD  benzonatate (TESSALON) 100 MG capsule Take 1 capsule (100 mg total) by mouth 3 (three) times daily as needed for cough. 06/07/13   Francine Graven, DO  predniSONE (DELTASONE) 20 MG tablet Take 2 tablets (40 mg total) by mouth daily. 06/07/13   Francine Graven, DO   BP 104/78  Pulse 100  Temp(Src) 98.5 F (36.9 C) (Oral)  Resp 20  Ht 5\' 4"  (1.626 m)  Wt 180 lb (81.647 kg)  BMI 30.88 kg/m2  SpO2 99%  LMP 03/21/2014 Physical Exam 0720: Physical examination:  Nursing notes reviewed; Vital signs and O2 SAT reviewed;  Constitutional: Well developed, Well nourished, Well hydrated, In no acute distress; Head:  Normocephalic, atraumatic; Eyes: EOMI, PERRL, No scleral icterus; ENMT: TM's clear bilat. +edemetous nasal turbinates bilat with clear rhinorrhea. Mouth and pharynx normal, Mucous membranes moist; Neck: Supple, Full range of motion, No lymphadenopathy; Cardiovascular: Regular rate and rhythm, No murmur, rub, or gallop; Respiratory: Breath sounds clear & equal bilaterally, No rales, rhonchi, wheezes.  Speaking full sentences with ease, Normal respiratory effort/excursion; Chest: Nontender, Movement normal; Abdomen: Soft, Nontender, Nondistended, Normal  bowel sounds; Genitourinary: No CVA tenderness; Extremities: Pulses normal, No tenderness, No edema, No calf edema or asymmetry.; Neuro: AA&Ox3, Major CN grossly intact.  Speech clear. No gross focal motor or sensory deficits in extremities.; Skin: Color normal, Warm, Dry.   ED Course  Procedures     MDM  MDM Reviewed:  previous chart, nursing note and vitals    0730:  Pt states she "feels better" after neb x2.  NAD, lungs CTA bilat, no wheezing, resps easy, speaking full sentences, Sats 99% R/A.  Pt states she wants to go home now. Requesting refill of her MDI. Will also tx with PO steroid. Dx and testing d/w pt.  Questions answered.  Verb understanding, agreeable to d/c home with outpt f/u.       Francine Graven, DO 03/30/14 [phone]

## 2014-04-27 ENCOUNTER — Emergency Department (HOSPITAL_COMMUNITY): Payer: Medicaid Other

## 2014-04-27 ENCOUNTER — Encounter (HOSPITAL_COMMUNITY): Payer: Self-pay | Admitting: Emergency Medicine

## 2014-04-27 ENCOUNTER — Emergency Department (HOSPITAL_COMMUNITY)
Admission: EM | Admit: 2014-04-27 | Discharge: 2014-04-27 | Disposition: A | Discharge status: Home / Self Care | Payer: Medicaid Other | Attending: Emergency Medicine | Admitting: Emergency Medicine

## 2014-04-27 DIAGNOSIS — Z79899 Other long term (current) drug therapy: Secondary | ICD-10-CM | POA: Insufficient documentation

## 2014-04-27 DIAGNOSIS — J40 Bronchitis, not specified as acute or chronic: Secondary | ICD-10-CM

## 2014-04-27 DIAGNOSIS — R05 Cough: Secondary | ICD-10-CM | POA: Diagnosis not present

## 2014-04-27 DIAGNOSIS — Z7952 Long term (current) use of systemic steroids: Secondary | ICD-10-CM | POA: Diagnosis not present

## 2014-04-27 DIAGNOSIS — R059 Cough, unspecified: Secondary | ICD-10-CM

## 2014-04-27 DIAGNOSIS — R079 Chest pain, unspecified: Secondary | ICD-10-CM | POA: Diagnosis present

## 2014-04-27 DIAGNOSIS — J45909 Unspecified asthma, uncomplicated: Secondary | ICD-10-CM | POA: Diagnosis not present

## 2014-04-27 LAB — CBC
HCT: 34.6 % — ABNORMAL LOW (ref 36.0–46.0)
Hemoglobin: 11 g/dL — ABNORMAL LOW (ref 12.0–15.0)
MCH: 22.3 pg — ABNORMAL LOW (ref 26.0–34.0)
MCHC: 31.8 g/dL (ref 30.0–36.0)
MCV: 70.2 fL — ABNORMAL LOW (ref 78.0–100.0)
Platelets: 310 10*3/uL (ref 150–400)
RBC: 4.93 MIL/uL (ref 3.87–5.11)
RDW: 14.2 % (ref 11.5–15.5)
WBC: 9.1 10*3/uL (ref 4.0–10.5)

## 2014-04-27 LAB — BASIC METABOLIC PANEL
Anion gap: 11 (ref 5–15)
BUN: 13 mg/dL (ref 6–23)
CO2: 26 mEq/L (ref 19–32)
Calcium: 8.9 mg/dL (ref 8.4–10.5)
Chloride: 101 mEq/L (ref 96–112)
Creatinine, Ser: 0.73 mg/dL (ref 0.50–1.10)
GFR calc Af Amer: >90 mL/min (ref >90)
GFR calc non Af Amer: >90 mL/min (ref >90)
Glucose, Bld: 82 mg/dL (ref 70–99)
Potassium: 3.5 mEq/L — ABNORMAL LOW (ref 3.7–5.3)
Sodium: 138 mEq/L (ref 137–147)

## 2014-04-27 LAB — TROPONIN I: Troponin I: <0.3 ng/mL (ref <0.30)

## 2014-04-27 MED ORDER — ALBUTEROL SULFATE HFA 108 (90 BASE) MCG/ACT IN AERS
2.0000 | INHALATION_SPRAY | Freq: Once | RESPIRATORY_TRACT | Status: AC
  Administered 2014-04-27: 2 via RESPIRATORY_TRACT
  Filled 2014-04-27: qty 6.7

## 2014-04-27 NOTE — ED Provider Notes (Signed)
CSN: [id number]     Arrival date & time 04/27/14  1803 History   First MD Initiated Contact with Patient 04/27/14 [place]     Chief Complaint  Patient presents with  . Chest Pain     (Consider location/radiation/quality/duration/timing/severity/associated sxs/prior Treatment) HPI   38 year old female with cough and shortness of breath. Actually started shortly before arrival. Persistent since then. Tightness in the center of her chest. No fevers or chills. Cough is nonproductive. No unusual leg pain or swelling. No sick contacts. No nausea or vomiting. No 7 change with exertion. No intervention prior to arrival. Past history of asthma.  Past Medical History  Diagnosis Date  . Asthma   . Bronchitis    History reviewed. No pertinent past surgical history. History reviewed. No pertinent family history. History  Substance Use Topics  . Smoking status: Never Smoker   . Smokeless tobacco: Not on file  . Alcohol Use: No   OB History    No data available     Review of Systems  All systems reviewed and negative, other than as noted in HPI.   Allergies  Peanuts  Home Medications   Prior to Admission medications   Medication Sig Start Date End Date Taking? Authorizing Provider  albuterol (PROVENTIL HFA;VENTOLIN HFA) 108 (90 BASE) MCG/ACT inhaler Inhale 4 puffs into the lungs every 4 (four) hours as needed for wheezing or shortness of breath. 03/12/13   Merryl Hacker, MD  benzonatate (TESSALON) 100 MG capsule Take 1 capsule (100 mg total) by mouth 3 (three) times daily as needed for cough. 06/07/13   Francine Graven, DO  predniSONE (DELTASONE) 20 MG tablet Take 2 tablets (40 mg total) by mouth daily. 06/07/13   Francine Graven, DO  predniSONE (DELTASONE) 20 MG tablet Take 2 tablets (40 mg total) by mouth daily. Start 03/29/2014 03/28/14   Francine Graven, DO   BP 126/77 mmHg  Pulse 86  Temp(Src) 98.6 F (37 C) (Oral)  Resp 18  Ht 5\' 6"  (1.676 m)  Wt 180 lb (81.647 kg)   BMI 29.07 kg/m2  SpO2 100%  LMP 03/21/2014 Physical Exam  Constitutional: She appears well-developed and well-nourished. No distress.  HENT:  Head: Normocephalic and atraumatic.  Eyes: Conjunctivae are normal. Right eye exhibits no discharge. Left eye exhibits no discharge.  Neck: Neck supple.  Cardiovascular: Normal rate, regular rhythm and normal heart sounds.  Exam reveals no gallop and no friction rub.   No murmur heard. Pulmonary/Chest: Effort normal and breath sounds normal. No respiratory distress.  Mild expiratory wheezing  Abdominal: Soft. She exhibits no distension. There is no tenderness.  Musculoskeletal: She exhibits no edema or tenderness.  Lower extremities symmetric as compared to each other. No calf tenderness. Negative Homan's. No palpable cords.   Neurological: She is alert.  Skin: Skin is warm and dry. She is not diaphoretic.  Psychiatric: She has a normal mood and affect. Her behavior is normal. Thought content normal.  Nursing note and vitals reviewed.   ED Course  Procedures (including critical care time) Labs Review Labs Reviewed  BASIC METABOLIC PANEL - Abnormal; Notable for the following:    Potassium 3.5 (*)    All other components within normal limits  CBC - Abnormal; Notable for the following:    Hemoglobin 11.0 (*)    HCT 34.6 (*)    MCV 70.2 (*)    MCH 22.3 (*)    All other components within normal limits  TROPONIN I    Imaging Review  Dg Chest 2 View (if Patient Has Fever And/or Copd)  04/27/2014   CLINICAL DATA:  Chest pain, history of asthma  EXAM: CHEST  2 VIEW  COMPARISON:  06/07/2013  FINDINGS: Cardiac shadow is within normal limits. The lungs are well aerated bilaterally without focal infiltrate or sizable effusion. No acute bony abnormality is seen.  IMPRESSION: No active cardiopulmonary disease.   Electronically Signed   By: Inez Catalina M.D.   On: 04/27/2014 18:46     EKG Interpretation None       EKG:  Rhythm: normal  sinus Rate: 83 Axis: normal PR: 156 ms QRS: 80 ms QTc: 439 ST segments: normal   MDM   Final diagnoses:  Cough  Bronchitis    38 year old female with likely viral bronchitis. Mild wheezing on exam. No significant increased work of breathing. Oxygen saturations were normal. Chest x-ray with no focal infiltrate. Plan symptomatic treatment. Return precautions discussed.    Virgel Manifold, MD 05/03/14 [id number]

## 2014-04-27 NOTE — ED Notes (Signed)
Pt reports chest pain and sob that started a few minutes prior to arrival. Pt denies any n/v. No dyspnea noted in triage. Airway patent. nad noted.

## 2014-06-15 ENCOUNTER — Encounter (HOSPITAL_COMMUNITY): Payer: Self-pay | Admitting: *Deleted

## 2014-06-15 ENCOUNTER — Emergency Department (HOSPITAL_COMMUNITY)
Admission: EM | Admit: 2014-06-15 | Discharge: 2014-06-15 | Disposition: A | Discharge status: Home / Self Care | Payer: Medicaid Other | Attending: Emergency Medicine | Admitting: Emergency Medicine

## 2014-06-15 DIAGNOSIS — Z79899 Other long term (current) drug therapy: Secondary | ICD-10-CM | POA: Diagnosis not present

## 2014-06-15 DIAGNOSIS — Z789 Other specified health status: Secondary | ICD-10-CM

## 2014-06-15 DIAGNOSIS — R0602 Shortness of breath: Secondary | ICD-10-CM | POA: Diagnosis present

## 2014-06-15 DIAGNOSIS — J4521 Mild intermittent asthma with (acute) exacerbation: Secondary | ICD-10-CM | POA: Insufficient documentation

## 2014-06-15 MED ORDER — ALBUTEROL SULFATE HFA 108 (90 BASE) MCG/ACT IN AERS
2.0000 | INHALATION_SPRAY | RESPIRATORY_TRACT | Status: DC | PRN
  Administered 2014-06-15: 2 via RESPIRATORY_TRACT
  Filled 2014-06-15: qty 6.7

## 2014-06-15 NOTE — Discharge Instructions (Signed)

## 2014-06-15 NOTE — ED Provider Notes (Signed)
CSN: [id number]     Arrival date & time 06/15/14  2056 History  This chart was scribed for Shaune Pollack, MD by Chester Holstein, ED Scribe. This patient was seen in room [facility]10/[facility]10 and the patient's care was started at 9:31 PM.    Chief Complaint  Patient presents with  . Shortness of Breath     Patient is a 39 y.o. female presenting with shortness of breath. The history is provided by the patient. No language interpreter was used.  Shortness of Breath Associated symptoms: no cough and no fever     HPI Comments: Linda Carroll is a 39 y.o. female with PMHx of asthma who presents to the Emergency Department complaining of shortness of breath.  Pt notes she is Pt denies smoking. Pt notes she has never been hospitalized.  She notes she has been on prednisone in the past. Pt denies other PMHx and medication use. Pt has NKDA. Pt denies rhinorrhea, fever, and productive cough.    Past Medical History  Diagnosis Date  . Asthma   . Bronchitis    History reviewed. No pertinent past surgical history. History reviewed. No pertinent family history. History  Substance Use Topics  . Smoking status: Never Smoker   . Smokeless tobacco: Not on file  . Alcohol Use: No   OB History    No data available     Review of Systems  Constitutional: Negative for fever.  HENT: Negative for rhinorrhea.   Respiratory: Positive for shortness of breath. Negative for cough.       Allergies  Peanuts  Home Medications   Prior to Admission medications   Medication Sig Start Date End Date Taking? Authorizing Provider  albuterol (PROVENTIL HFA;VENTOLIN HFA) 108 (90 BASE) MCG/ACT inhaler Inhale 4 puffs into the lungs every 4 (four) hours as needed for wheezing or shortness of breath. 03/12/13   Merryl Hacker, MD  benzonatate (TESSALON) 100 MG capsule Take 1 capsule (100 mg total) by mouth 3 (three) times daily as needed for cough. Patient not taking: Reported on 06/15/2014 06/07/13   Francine Graven, DO   predniSONE (DELTASONE) 20 MG tablet Take 2 tablets (40 mg total) by mouth daily. Patient not taking: Reported on 06/15/2014 06/07/13   Francine Graven, DO  predniSONE (DELTASONE) 20 MG tablet Take 2 tablets (40 mg total) by mouth daily. Start 03/29/2014 Patient not taking: Reported on 06/15/2014 03/28/14   Francine Graven, DO   BP 136/78 mmHg  Pulse 79  Temp(Src) 98.4 F (36.9 C) (Oral)  Resp 20  SpO2 97%  LMP 06/11/2014 Physical Exam  Constitutional: She is oriented to person, place, and time. She appears well-developed and well-nourished. No distress.  HENT:  Head: Normocephalic and atraumatic.  Right Ear: External ear normal.  Left Ear: External ear normal.  Nose: Nose normal.  Eyes: Conjunctivae and EOM are normal. Pupils are equal, round, and reactive to light.  Neck: Normal range of motion. Neck supple.  Cardiovascular: Normal rate and regular rhythm.   Pulmonary/Chest: Effort normal and breath sounds normal. No respiratory distress. She has no wheezes. She has no rales.  Musculoskeletal: Normal range of motion.  Neurological: She is alert and oriented to person, place, and time. She exhibits normal muscle tone. Coordination normal.  Skin: Skin is warm and dry.  Psychiatric: She has a normal mood and affect. Her behavior is normal. Thought content normal.  Nursing note and vitals reviewed.   ED Course  Procedures (including critical care time) DIAGNOSTIC STUDIES:  Oxygen Saturation is 97% on room air, normal by my interpretation.    COORDINATION OF CARE: 9:33 PM Discussed treatment plan with patient at beside, the patient agrees with the plan and has no further questions at this time.   Labs Review Labs Reviewed - No data to display  Imaging Review No results found.   EKG Interpretation None      MDM   Final diagnoses:  Asthma, mild intermittent, with acute exacerbation  Has run out of medications    I personally performed the services described in this  documentation, which was scribed in my presence. The recorded information has been reviewed and considered.   Shaune Pollack, MD 06/18/14 [phone]

## 2014-06-15 NOTE — ED Notes (Signed)
Pt says she has asthma , this episode started tonight , says she is out of inhaler.  No cough

## 2014-08-30 ENCOUNTER — Emergency Department (HOSPITAL_COMMUNITY)
Admission: EM | Admit: 2014-08-30 | Discharge: 2014-08-30 | Disposition: A | Discharge status: Home / Self Care | Payer: Medicaid Other | Attending: Emergency Medicine | Admitting: Emergency Medicine

## 2014-08-30 ENCOUNTER — Emergency Department (HOSPITAL_COMMUNITY): Payer: Medicaid Other

## 2014-08-30 ENCOUNTER — Encounter (HOSPITAL_COMMUNITY): Payer: Self-pay | Admitting: *Deleted

## 2014-08-30 DIAGNOSIS — Z7952 Long term (current) use of systemic steroids: Secondary | ICD-10-CM | POA: Insufficient documentation

## 2014-08-30 DIAGNOSIS — Z79899 Other long term (current) drug therapy: Secondary | ICD-10-CM | POA: Diagnosis not present

## 2014-08-30 DIAGNOSIS — J45901 Unspecified asthma with (acute) exacerbation: Secondary | ICD-10-CM

## 2014-08-30 DIAGNOSIS — Z760 Encounter for issue of repeat prescription: Secondary | ICD-10-CM | POA: Insufficient documentation

## 2014-08-30 DIAGNOSIS — R0602 Shortness of breath: Secondary | ICD-10-CM | POA: Diagnosis present

## 2014-08-30 MED ORDER — ALBUTEROL SULFATE HFA 108 (90 BASE) MCG/ACT IN AERS
2.0000 | INHALATION_SPRAY | RESPIRATORY_TRACT | Status: DC
  Administered 2014-08-30: 2 via RESPIRATORY_TRACT
  Filled 2014-08-30: qty 6.7

## 2014-08-30 NOTE — ED Notes (Signed)
Sob, out of inhaler, cough, non productive.  Alert,

## 2014-08-30 NOTE — Discharge Instructions (Signed)

## 2014-08-30 NOTE — ED Provider Notes (Signed)
CSN: [id number]     Arrival date & time 08/30/14  1839 History   First MD Initiated Contact with Patient 08/30/14 1912     Chief Complaint  Patient presents with  . Asthma     (Consider location/radiation/quality/duration/timing/severity/associated sxs/prior Treatment) Patient is a 39 y.o. female presenting with asthma. The history is provided by the patient.  Asthma This is a recurrent problem. The current episode started today. The problem occurs constantly. The problem has been resolved. Associated symptoms include coughing. Pertinent negatives include no chest pain, chills, congestion, fatigue, myalgias, nausea, neck pain, rash, sore throat or weakness. Associated symptoms comments: Patient started wheezing this evening around 6 PM.  She reached for her albuterol MDI which was empty.  She attempted to pick up her albuterol refill from her pharmacy, but was told she was unable to get this medication until next month.  She currently denies wheezing, shortness of breath which seems to  have improved since arriving here.. Nothing aggravates the symptoms. She has tried nothing for the symptoms.    Past Medical History  Diagnosis Date  . Asthma   . Bronchitis    History reviewed. No pertinent past surgical history. No family history on file. History  Substance Use Topics  . Smoking status: Never Smoker   . Smokeless tobacco: Not on file  . Alcohol Use: No   OB History    No data available     Review of Systems  Constitutional: Negative for chills and fatigue.  HENT: Negative for congestion and sore throat.   Respiratory: Positive for cough, shortness of breath and wheezing.   Cardiovascular: Negative for chest pain.  Gastrointestinal: Negative for nausea.  Musculoskeletal: Negative for myalgias and neck pain.  Skin: Negative for rash.  Neurological: Negative for weakness.      Allergies  Peanuts  Home Medications   Prior to Admission medications   Medication Sig Start  Date End Date Taking? Authorizing Provider  albuterol (PROVENTIL HFA;VENTOLIN HFA) 108 (90 BASE) MCG/ACT inhaler Inhale 4 puffs into the lungs every 4 (four) hours as needed for wheezing or shortness of breath. 03/12/13   Merryl Hacker, MD  benzonatate (TESSALON) 100 MG capsule Take 1 capsule (100 mg total) by mouth 3 (three) times daily as needed for cough. Patient not taking: Reported on 06/15/2014 06/07/13   Francine Graven, DO  predniSONE (DELTASONE) 20 MG tablet Take 2 tablets (40 mg total) by mouth daily. Patient not taking: Reported on 06/15/2014 06/07/13   Francine Graven, DO  predniSONE (DELTASONE) 20 MG tablet Take 2 tablets (40 mg total) by mouth daily. Start 03/29/2014 Patient not taking: Reported on 06/15/2014 03/28/14   Francine Graven, DO   BP 132/69 mmHg  Pulse 86  Temp(Src) 99 F (37.2 C) (Oral)  Resp 16  Ht 5\' 2"  (1.575 m)  Wt 196 lb 8 oz (89.132 kg)  BMI 35.93 kg/m2  SpO2 100%  LMP 08/30/2014 Physical Exam  Constitutional: She appears well-developed and well-nourished.  HENT:  Head: Normocephalic and atraumatic.  Eyes: Conjunctivae are normal.  Neck: Normal range of motion.  Cardiovascular: Normal rate, regular rhythm, normal heart sounds and intact distal pulses.   Pulmonary/Chest: Effort normal and breath sounds normal. No respiratory distress. She has no wheezes. She has no rales. She exhibits no tenderness.  Abdominal: Soft. Bowel sounds are normal. There is no tenderness.  Musculoskeletal: Normal range of motion. She exhibits no edema.  No ankle edema  Neurological: She is alert.  Skin: Skin is  warm and dry.  Psychiatric: She has a normal mood and affect.  Nursing note and vitals reviewed.   ED Course  Procedures (including critical care time) Labs Review Labs Reviewed - No data to display  Imaging Review Dg Chest 2 View  08/30/2014   CLINICAL DATA:  Central chest pain and shortness of breath for several hours. Asthma.  EXAM: CHEST  2 VIEW   COMPARISON:  04/27/2014  FINDINGS: The heart size and mediastinal contours are within normal limits. Both lungs are clear. The visualized skeletal structures are unremarkable.  IMPRESSION: No active cardiopulmonary disease.   Electronically Signed   By: Earle Gell M.D.   On: 08/30/2014 19:05     EKG Interpretation None      MDM   Final diagnoses:  Asthma exacerbation  Medication refill    Asthma exacerbation per patient history, normal exam here without wheezing or decreased aeration.  X-ray was reviewed and is normal.  She was given an albuterol MDI for every 4 hours when necessary use.  She has a spacer already.  Advised when necessary follow-up either here or with her PCP if her symptoms persist or worsen.  She has no peripheral edema, no respiratory rales, no history of CHF or other medical conditions which could cause her symptoms.  The patient appears reasonably screened and/or stabilized for discharge and I doubt any other medical condition or other [facility] requiring further screening, evaluation, or treatment in the ED at this time prior to discharge.     Evalee Jefferson, PA-C 08/30/14 2012  Milton Ferguson, MD 09/03/14 [phone]

## 2014-08-30 NOTE — ED Notes (Signed)
Pt states her asthma began acting up as she was lying down at 1800. No wheezing noted at this time.

## 2014-12-13 ENCOUNTER — Emergency Department (HOSPITAL_COMMUNITY)
Admission: EM | Admit: 2014-12-13 | Discharge: 2014-12-13 | Disposition: A | Discharge status: Home / Self Care | Payer: Medicaid Other | Attending: Emergency Medicine | Admitting: Emergency Medicine

## 2014-12-13 ENCOUNTER — Encounter (HOSPITAL_COMMUNITY): Payer: Self-pay | Admitting: Emergency Medicine

## 2014-12-13 DIAGNOSIS — J45901 Unspecified asthma with (acute) exacerbation: Secondary | ICD-10-CM | POA: Diagnosis not present

## 2014-12-13 DIAGNOSIS — Z79899 Other long term (current) drug therapy: Secondary | ICD-10-CM | POA: Insufficient documentation

## 2014-12-13 DIAGNOSIS — R05 Cough: Secondary | ICD-10-CM | POA: Diagnosis present

## 2014-12-13 MED ORDER — ALBUTEROL SULFATE HFA 108 (90 BASE) MCG/ACT IN AERS
2.0000 | INHALATION_SPRAY | Freq: Once | RESPIRATORY_TRACT | Status: AC
  Administered 2014-12-13: 2 via RESPIRATORY_TRACT
  Filled 2014-12-13: qty 6.7

## 2014-12-13 MED ORDER — PREDNISONE 10 MG PO TABS
ORAL_TABLET | ORAL | Status: DC
Start: 2014-12-13 — End: 2016-05-08

## 2014-12-13 MED ORDER — PREDNISONE 50 MG PO TABS
60.0000 mg | ORAL_TABLET | Freq: Once | ORAL | Status: AC
  Administered 2014-12-13: 60 mg via ORAL
  Filled 2014-12-13 (×2): qty 1

## 2014-12-13 MED ORDER — HYDROCOD POLST-CPM POLST ER 10-8 MG/5ML PO SUER
5.0000 mL | Freq: Once | ORAL | Status: AC
  Administered 2014-12-13: 5 mL via ORAL
  Filled 2014-12-13: qty 5

## 2014-12-13 MED ORDER — ALBUTEROL SULFATE (2.5 MG/3ML) 0.083% IN NEBU
2.5000 mg | INHALATION_SOLUTION | Freq: Once | RESPIRATORY_TRACT | Status: AC
  Administered 2014-12-13: 2.5 mg via RESPIRATORY_TRACT
  Filled 2014-12-13: qty 3

## 2014-12-13 NOTE — ED Notes (Signed)
Having asthma attack when coughing.  Out of medication for asthma.

## 2014-12-13 NOTE — ED Notes (Signed)
Pt states she if beginning to feel better. NAD.

## 2014-12-13 NOTE — ED Provider Notes (Signed)
CSN: [id number]     Arrival date & time 12/13/14  1356 History   First MD Initiated Contact with Patient 12/13/14 1428     Chief Complaint  Patient presents with  . Cough  . Asthma     (Consider location/radiation/quality/duration/timing/severity/associated sxs/prior Treatment) Patient is a 39 y.o. female presenting with cough. The history is provided by the patient.  Cough Cough characteristics:  Non-productive Severity:  Moderate Onset quality:  Gradual Duration:  2 days Timing:  Intermittent Progression:  Worsening Smoker: no   Context: weather changes   Relieved by:  Nothing Ineffective treatments:  None tried (out of asthma meds) Associated symptoms: shortness of breath, sinus congestion and wheezing   Associated symptoms: no fever   Risk factors: no recent travel     Past Medical History  Diagnosis Date  . Asthma   . Bronchitis    History reviewed. No pertinent past surgical history. History reviewed. No pertinent family history. History  Substance Use Topics  . Smoking status: Never Smoker   . Smokeless tobacco: Not on file  . Alcohol Use: No   OB History    No data available     Review of Systems  Constitutional: Negative for fever.  Respiratory: Positive for cough, shortness of breath and wheezing.   All other systems reviewed and are negative.     Allergies  Peanuts  Home Medications   Prior to Admission medications   Medication Sig Start Date End Date Taking? Authorizing Provider  albuterol (PROVENTIL HFA;VENTOLIN HFA) 108 (90 BASE) MCG/ACT inhaler Inhale 4 puffs into the lungs every 4 (four) hours as needed for wheezing or shortness of breath. 03/12/13   Merryl Hacker, MD  benzonatate (TESSALON) 100 MG capsule Take 1 capsule (100 mg total) by mouth 3 (three) times daily as needed for cough. Patient not taking: Reported on 06/15/2014 06/07/13   Francine Graven, DO  predniSONE (DELTASONE) 20 MG tablet Take 2 tablets (40 mg total) by mouth  daily. Patient not taking: Reported on 06/15/2014 06/07/13   Francine Graven, DO  predniSONE (DELTASONE) 20 MG tablet Take 2 tablets (40 mg total) by mouth daily. Start 03/29/2014 Patient not taking: Reported on 06/15/2014 03/28/14   Francine Graven, DO   BP 145/73 mmHg  Pulse 83  Temp(Src) 98.5 F (36.9 C) (Oral)  Resp 18  Ht 5\' 2"  (1.575 m)  Wt 197 lb (89.359 kg)  BMI 36.02 kg/m2  SpO2 100%  LMP 12/08/2014 Physical Exam  Constitutional: She is oriented to person, place, and time. She appears well-developed and well-nourished.  Non-toxic appearance.  HENT:  Head: Normocephalic.  Right Ear: Tympanic membrane and external ear normal.  Left Ear: Tympanic membrane and external ear normal.  Airway patent. Mild to mod nasal congestion.  Eyes: EOM and lids are normal. Pupils are equal, round, and reactive to light.  Neck: Normal range of motion. Neck supple. Carotid bruit is not present.  Cardiovascular: Normal rate, regular rhythm, normal heart sounds, intact distal pulses and normal pulses.   Pulmonary/Chest: No respiratory distress. She has wheezes.  Course breath sounds.  Abdominal: Soft. Bowel sounds are normal. There is no tenderness. There is no guarding.  Musculoskeletal: Normal range of motion. She exhibits no edema or tenderness.  Neg Homan's sign  Lymphadenopathy:       Head (right side): No submandibular adenopathy present.       Head (left side): No submandibular adenopathy present.    She has no cervical adenopathy.  Neurological: She is  alert and oriented to person, place, and time. She has normal strength. No cranial nerve deficit or sensory deficit.  Skin: Skin is warm and dry.  Psychiatric: She has a normal mood and affect. Her speech is normal.  Nursing note and vitals reviewed.   ED Course  Procedures (including critical care time) Labs Review Labs Reviewed - No data to display  Imaging Review No results found.   EKG Interpretation None       MDM  Vital signs stable. Pulse ox 100% on room air. WNL by my interpretation. Breathing improved after neb treatment. Cough moderately improved. Pt will use nasal decongestant of choice. Albuterol inhaler given. Rx for prednisone given. Temp wnl, pulse ox wnl, pt speaks in complete sentences. Feel it is safe for patient to be discharged home.    Final diagnoses:  None    **I have reviewed nursing notes, vital signs, and all appropriate lab and imaging results for this patient.Lily Kocher, PA-C 12/15/14 3212  Fredia Sorrow, MD 12/15/14 [phone]

## 2014-12-13 NOTE — Discharge Instructions (Signed)
Please see your physicians at the health department for your daily asthma medication systems possible. Use your rescue inhaler (albuterol) every 4 hours if needed. Please use prednisone taper as directed until all taken. Asthma Asthma is a condition of the lungs in which the airways tighten and narrow. Asthma can make it hard to breathe. Asthma cannot be cured, but medicine and lifestyle changes can help control it. Asthma may be started (triggered) by:  Animal skin flakes (dander).  Dust.  Cockroaches.  Pollen.  Mold.  Smoke.  Cleaning products.  Hair sprays or aerosol sprays.  Paint fumes or strong smells.  Cold air, weather changes, and winds.  Crying or laughing hard.  Stress.  Certain medicines or drugs.  Foods, such as dried fruit, potato chips, and sparkling grape juice.  Infections or conditions (colds, flu).  Exercise.  Certain medical conditions or diseases.  Exercise or tiring activities. HOME CARE   Take medicine as told by your doctor.  Use a peak flow meter as told by your doctor. A peak flow meter is a tool that measures how well the lungs are working.  Record and keep track of the peak flow meter's readings.  Understand and use the asthma action plan. An asthma action plan is a written plan for taking care of your asthma and treating your attacks.  To help prevent asthma attacks:  Do not smoke. Stay away from secondhand smoke.  Change your heating and air conditioning filter often.  Limit your use of fireplaces and wood stoves.  Get rid of pests (such as roaches and mice) and their droppings.  Throw away plants if you see mold on them.  Clean your floors. Dust regularly. Use cleaning products that do not smell.  Have someone vacuum when you are not home. Use a vacuum cleaner with a HEPA filter if possible.  Replace carpet with wood, tile, or vinyl flooring. Carpet can trap animal skin flakes and dust.  Use allergy-proof pillows,  mattress covers, and box spring covers.  Wash bed sheets and blankets every week in hot water and dry them in a dryer.  Use blankets that are made of polyester or cotton.  Clean bathrooms and kitchens with bleach. If possible, have someone repaint the walls in these rooms with mold-resistant paint. Keep out of the rooms that are being cleaned and painted.  Wash hands often. GET HELP IF:  You have make a whistling sound when breaking (wheeze), have shortness of breath, or have a cough even if taking medicine to prevent attacks.  The colored mucus you cough up (sputum) is thicker than usual.  The colored mucus you cough up changes from clear or white to yellow, green, gray, or bloody.  You have problems from the medicine you are taking such as:  A rash.  Itching.  Swelling.  Trouble breathing.  You need reliever medicines more than 2-3 times a week.  Your peak flow measurement is still at 50-79% of your personal best after following the action plan for 1 hour.  You have a fever. GET HELP RIGHT AWAY IF:   You seem to be worse and are not responding to medicine during an asthma attack.  You are short of breath even at rest.  You get short of breath when doing very little activity.  You have trouble eating, drinking, or talking.  You have chest pain.  You have a fast heartbeat.  Your lips or fingernails start to turn blue.  You are light-headed, dizzy, or  faint.  Your peak flow is less than 50% of your personal best. MAKE SURE YOU:   Understand these instructions.  Will watch your condition.  Will get help right away if you are not doing well or get worse. Document Released: 11/18/2007 Document Revised: 10/16/2013 Document Reviewed: 12/29/2012 [facility] Patient Information 2015 [place], [place]. This information is not intended to replace advice given to you by your health care provider. Make sure you discuss any questions you have with your health care  provider.

## 2015-11-22 ENCOUNTER — Other Ambulatory Visit (HOSPITAL_COMMUNITY): Payer: Self-pay | Admitting: Nurse Practitioner

## 2015-11-22 DIAGNOSIS — N921 Excessive and frequent menstruation with irregular cycle: Secondary | ICD-10-CM

## 2015-11-26 ENCOUNTER — Ambulatory Visit (HOSPITAL_COMMUNITY)
Admission: RE | Admit: 2015-11-26 | Discharge: 2015-11-26 | Disposition: A | Discharge status: Home / Self Care | Payer: Medicaid Other | Source: Ambulatory Visit | Attending: Nurse Practitioner | Admitting: Nurse Practitioner

## 2015-11-26 DIAGNOSIS — N921 Excessive and frequent menstruation with irregular cycle: Secondary | ICD-10-CM | POA: Diagnosis not present

## 2015-11-26 DIAGNOSIS — D259 Leiomyoma of uterus, unspecified: Secondary | ICD-10-CM | POA: Diagnosis not present

## 2016-05-08 ENCOUNTER — Observation Stay (HOSPITAL_COMMUNITY)
Admission: EM | Admit: 2016-05-08 | Discharge: 2016-05-09 | Disposition: A | Discharge status: Home / Self Care | Payer: Medicaid Other | Attending: Internal Medicine | Admitting: Internal Medicine

## 2016-05-08 ENCOUNTER — Encounter (HOSPITAL_COMMUNITY): Payer: Self-pay | Admitting: Emergency Medicine

## 2016-05-08 ENCOUNTER — Emergency Department (HOSPITAL_COMMUNITY): Payer: Medicaid Other

## 2016-05-08 DIAGNOSIS — R0602 Shortness of breath: Secondary | ICD-10-CM | POA: Diagnosis present

## 2016-05-08 DIAGNOSIS — Z79899 Other long term (current) drug therapy: Secondary | ICD-10-CM | POA: Diagnosis not present

## 2016-05-08 DIAGNOSIS — Z91010 Allergy to peanuts: Secondary | ICD-10-CM | POA: Insufficient documentation

## 2016-05-08 DIAGNOSIS — J45901 Unspecified asthma with (acute) exacerbation: Principal | ICD-10-CM | POA: Insufficient documentation

## 2016-05-08 DIAGNOSIS — J4521 Mild intermittent asthma with (acute) exacerbation: Secondary | ICD-10-CM | POA: Diagnosis not present

## 2016-05-08 DIAGNOSIS — J45909 Unspecified asthma, uncomplicated: Secondary | ICD-10-CM

## 2016-05-08 LAB — CBC WITH DIFFERENTIAL/PLATELET
BASOS ABS: 0 10*3/uL (ref 0.0–0.1)
Basophils Relative: 0 %
Eosinophils Absolute: 0.6 10*3/uL (ref 0.0–0.7)
Eosinophils Relative: 6 %
HCT: 37.6 % (ref 36.0–46.0)
Hemoglobin: 11.7 g/dL — ABNORMAL LOW (ref 12.0–15.0)
LYMPHS PCT: 20 %
Lymphs Abs: 2 10*3/uL (ref 0.7–4.0)
MCH: 21.7 pg — ABNORMAL LOW (ref 26.0–34.0)
MCHC: 31.1 g/dL (ref 30.0–36.0)
MCV: 69.6 fL — AB (ref 78.0–100.0)
Monocytes Absolute: 0.5 10*3/uL (ref 0.1–1.0)
Monocytes Relative: 5 %
NEUTROS ABS: 6.9 10*3/uL (ref 1.7–7.7)
NEUTROS PCT: 70 %
PLATELETS: 387 10*3/uL (ref 150–400)
RBC: 5.4 MIL/uL — AB (ref 3.87–5.11)
RDW: 14.4 % (ref 11.5–15.5)
WBC: 9.9 10*3/uL (ref 4.0–10.5)

## 2016-05-08 LAB — COMPREHENSIVE METABOLIC PANEL
ALBUMIN: 3.9 g/dL (ref 3.5–5.0)
ALK PHOS: 54 U/L (ref 38–126)
ALT: 10 U/L — ABNORMAL LOW (ref 14–54)
ANION GAP: 8 (ref 5–15)
AST: 15 U/L (ref 15–41)
BUN: 6 mg/dL (ref 6–20)
CALCIUM: 9.1 mg/dL (ref 8.9–10.3)
CO2: 26 mmol/L (ref 22–32)
Chloride: 105 mmol/L (ref 101–111)
Creatinine, Ser: 0.78 mg/dL (ref 0.44–1.00)
GFR calc non Af Amer: >60 mL/min (ref >60)
GLUCOSE: 121 mg/dL — AB (ref 65–99)
POTASSIUM: 3.5 mmol/L (ref 3.5–5.1)
Sodium: 139 mmol/L (ref 135–145)
TOTAL PROTEIN: 7.9 g/dL (ref 6.5–8.1)
Total Bilirubin: 0.8 mg/dL (ref 0.3–1.2)

## 2016-05-08 MED ORDER — ONDANSETRON HCL 4 MG PO TABS: 4.0000 mg | ORAL_TABLET | Freq: Four times a day (QID) | ORAL | Status: DC | PRN

## 2016-05-08 MED ORDER — ACETAMINOPHEN 325 MG PO TABS: 650.0000 mg | ORAL_TABLET | Freq: Four times a day (QID) | ORAL | Status: DC | PRN

## 2016-05-08 MED ORDER — ENOXAPARIN SODIUM 40 MG/0.4ML ~~LOC~~ SOLN
40.0000 mg | SUBCUTANEOUS | Status: DC
  Administered 2016-05-09: 40 mg via SUBCUTANEOUS
  Filled 2016-05-08: qty 0.4

## 2016-05-08 MED ORDER — METHYLPREDNISOLONE SODIUM SUCC 125 MG IJ SOLR
60.0000 mg | Freq: Four times a day (QID) | INTRAMUSCULAR | Status: DC
  Administered 2016-05-08 – 2016-05-09 (×4): 60 mg via INTRAVENOUS
  Filled 2016-05-08 (×4): qty 2

## 2016-05-08 MED ORDER — ALBUTEROL SULFATE (2.5 MG/3ML) 0.083% IN NEBU
2.5000 mg | INHALATION_SOLUTION | RESPIRATORY_TRACT | Status: DC | PRN
  Administered 2016-05-08: 2.5 mg via RESPIRATORY_TRACT
  Filled 2016-05-08: qty 3

## 2016-05-08 MED ORDER — ALBUTEROL (5 MG/ML) CONTINUOUS INHALATION SOLN
10.0000 mg/h | INHALATION_SOLUTION | RESPIRATORY_TRACT | Status: DC
  Administered 2016-05-08: 10 mg/h via RESPIRATORY_TRACT

## 2016-05-08 MED ORDER — SENNOSIDES-DOCUSATE SODIUM 8.6-50 MG PO TABS: 1.0000 | ORAL_TABLET | Freq: Every evening | ORAL | Status: DC | PRN

## 2016-05-08 MED ORDER — ACETAMINOPHEN 650 MG RE SUPP: 650.0000 mg | Freq: Four times a day (QID) | RECTAL | Status: DC | PRN

## 2016-05-08 MED ORDER — METHYLPREDNISOLONE SODIUM SUCC 125 MG IJ SOLR
125.0000 mg | Freq: Once | INTRAMUSCULAR | Status: AC
  Administered 2016-05-08: 125 mg via INTRAVENOUS
  Filled 2016-05-08: qty 2

## 2016-05-08 MED ORDER — SODIUM CHLORIDE 0.9 % IV SOLN: INTRAVENOUS | Status: DC

## 2016-05-08 MED ORDER — MAGNESIUM SULFATE 2 GM/50ML IV SOLN
INTRAVENOUS | Status: AC
  Administered 2016-05-08: 2 g via INTRAVENOUS
  Filled 2016-05-08: qty 50

## 2016-05-08 MED ORDER — MAGNESIUM SULFATE 2 GM/50ML IV SOLN
2.0000 g | Freq: Once | INTRAVENOUS | Status: AC
  Administered 2016-05-08: 2 g via INTRAVENOUS

## 2016-05-08 MED ORDER — ALBUTEROL (5 MG/ML) CONTINUOUS INHALATION SOLN
10.0000 mg/h | INHALATION_SOLUTION | RESPIRATORY_TRACT | Status: DC
  Administered 2016-05-08: 10 mg/h via RESPIRATORY_TRACT
  Filled 2016-05-08: qty 20

## 2016-05-08 MED ORDER — ONDANSETRON HCL 4 MG/2ML IJ SOLN: 4.0000 mg | Freq: Four times a day (QID) | INTRAMUSCULAR | Status: DC | PRN

## 2016-05-08 NOTE — H&P (Signed)
History and Physical    Linda Carroll [id number] DOB: 07-Feb-1976 DOA: 05/08/2016  Referring MD/NP/PA: Julianne Rice, EDP PCP: Pcp Not In System  Patient coming from: Home  Chief Complaint: SOB  HPI: Linda Carroll is a 40 y.o. female with h/o mild intermittent asthma that typically only flares up with a change in weather. She ran out of her albuterol MDI about 3 days ago. Since then has been having SOB that has progressively gotten worse. She received 2 continuous nebs in the ED and continued to be SOB, hence admission was requested. Labs are essentially WNL, CXR without acute findings.  Past Medical/Surgical History: Past Medical History:  Diagnosis Date  . Asthma   . Bronchitis     History reviewed. No pertinent surgical history.  Social History:  reports that she has never smoked. She does not have any smokeless tobacco history on file. She reports that she does not drink alcohol or use drugs.  Allergies: Allergies  Allergen Reactions  . Peanuts [Peanut Oil] Shortness Of Breath    Family History:  No h/o CVA. Heart disease or DM in family members  Prior to Admission medications   Medication Sig Start Date End Date Taking? Authorizing Provider  albuterol (PROVENTIL HFA;VENTOLIN HFA) 108 (90 BASE) MCG/ACT inhaler Inhale 4 puffs into the lungs every 4 (four) hours as needed for wheezing or shortness of breath. 03/12/13  Yes Merryl Hacker, MD    Review of Systems:  Constitutional: Denies fever, chills, diaphoresis, appetite change and fatigue.  HEENT: Denies photophobia, eye pain, redness, hearing loss, ear pain, congestion, sore throat, rhinorrhea, sneezing, mouth sores, trouble swallowing, neck pain, neck stiffness and tinnitus.   Respiratory: Denies  chest tightness. Cardiovascular: Denies chest pain, palpitations and leg swelling.  Gastrointestinal: Denies nausea, vomiting, abdominal pain, diarrhea, constipation, blood in stool and abdominal distention.    Genitourinary: Denies dysuria, urgency, frequency, hematuria, flank pain and difficulty urinating.  Endocrine: Denies: hot or cold intolerance, sweats, changes in hair or nails, polyuria, polydipsia. Musculoskeletal: Denies myalgias, back pain, joint swelling, arthralgias and gait problem.  Skin: Denies pallor, rash and wound.  Neurological: Denies dizziness, seizures, syncope, weakness, light-headedness, numbness and headaches.  Hematological: Denies adenopathy. Easy bruising, personal or family bleeding history  Psychiatric/Behavioral: Denies suicidal ideation, mood changes, confusion, nervousness, sleep disturbance and agitation    Physical Exam: Vitals:   05/08/16 1200 05/08/16 1300 05/08/16 1530 05/08/16 1548  BP: 108/55 114/57 124/66   Pulse: 112 108 97 104  Resp: 23 20 23 20   Temp:      TempSrc:      SpO2: 100% 100% 100% 100%  Weight:      Height:         Constitutional: NAD, calm, comfortable Eyes: PERRL, lids and conjunctivae normal ENMT: Mucous membranes are moist. Posterior pharynx clear of any exudate or lesions.Normal dentition.  Neck: normal, supple, no masses, no thyromegaly Respiratory: mild expiratory wheezes Cardiovascular: Regular rate and rhythm, no murmurs / rubs / gallops. No extremity edema. 2+ pedal pulses. No carotid bruits.  Abdomen: no tenderness, no masses palpated. No hepatosplenomegaly. Bowel sounds positive.  Musculoskeletal: no clubbing / cyanosis. No joint deformity upper and lower extremities. Good ROM, no contractures. Normal muscle tone.  Skin: no rashes, lesions, ulcers. No induration Neurologic: CN 2-12 grossly intact. Sensation intact, DTR normal. Strength 5/5 in all 4.  Psychiatric: Normal judgment and insight. Alert and oriented x 3. Normal mood.    Labs on Admission: I have personally reviewed  the following labs and imaging studies  CBC:  Recent Labs Lab 05/08/16 1122  WBC 9.9  NEUTROABS 6.9  HGB 11.7*  HCT 37.6  MCV 69.6*   PLT [id number]   Basic Metabolic Panel:  Recent Labs Lab 05/08/16 1122  NA 139  K 3.5  CL 105  CO2 26  GLUCOSE 121*  BUN 6  CREATININE 0.78  CALCIUM 9.1   GFR: Estimated Creatinine Clearance: 103 mL/min (by C-G formula based on SCr of 0.78 mg/dL). Liver Function Tests:  Recent Labs Lab 05/08/16 1122  AST 15  ALT 10*  ALKPHOS 54  BILITOT 0.8  PROT 7.9  ALBUMIN 3.9   No results for input(s): LIPASE, AMYLASE in the last 168 hours. No results for input(s): AMMONIA in the last 168 hours. Coagulation Profile: No results for input(s): INR, PROTIME in the last 168 hours. Cardiac Enzymes: No results for input(s): CKTOTAL, CKMB, CKMBINDEX, TROPONINI in the last 168 hours. BNP (last 3 results) No results for input(s): PROBNP in the last 8760 hours. HbA1C: No results for input(s): HGBA1C in the last 72 hours. CBG: No results for input(s): GLUCAP in the last 168 hours. Lipid Profile: No results for input(s): CHOL, HDL, LDLCALC, TRIG, CHOLHDL, LDLDIRECT in the last 72 hours. Thyroid Function Tests: No results for input(s): TSH, T4TOTAL, FREET4, T3FREE, THYROIDAB in the last 72 hours. Anemia Panel: No results for input(s): VITAMINB12, FOLATE, FERRITIN, TIBC, IRON, RETICCTPCT in the last 72 hours. Urine analysis: No results found for: COLORURINE, APPEARANCEUR, LABSPEC, PHURINE, GLUCOSEU, HGBUR, BILIRUBINUR, KETONESUR, PROTEINUR, UROBILINOGEN, NITRITE, LEUKOCYTESUR Sepsis Labs: @LABRCNTIP (procalcitonin:4,lacticidven:4) )No results found for this or any previous visit (from the past 240 hour(s)).   Radiological Exams on Admission: Dg Chest 2 View  Result Date: 05/08/2016 CLINICAL DATA:  Shortness of breath with exertion and productive cough since this morning, history asthma EXAM: CHEST  2 VIEW COMPARISON:  08/30/2014 FINDINGS: Normal heart size, mediastinal contours, and pulmonary vascularity. Lungs clear. No pleural effusion or pneumothorax. Bones unremarkable. IMPRESSION: Normal  exam. Electronically Signed   By: Lavonia Dana M.D.   On: 05/08/2016 10:14    EKG: Independently reviewed. Sinus tach vs possibly a flutter, no acute ischemic changes  Assessment/Plan Principal Problem:   Asthma exacerbation    Asthma with acute exacerbation -Refer for observation, steroids, frequent nebs. -No abx given negative CXR and lack of URI symptoms. -Given she appears to have only mild intermittent asthma, an albuterol MDI and a course of prednisone will likely be her only meds on DC.  Sinus Tachycardia vs a flutter -Difficult to discern based on EKG. -Tachyarrhythmia likely precipitated by albuterol nebs. -Recheck EKG in am.   DVT prophylaxis: lovenox  Code Status: full code  Family Communication: patient only  Disposition Plan: likely DC in 24-48 hours with clinical improvement  Consults called: none  Admission status: observation    Time Spent: 75 minutes  Lelon Frohlich MD [facility] Pager [phone]  If 7PM-7AM, please contact night-coverage [email or web address] Password [facility]  05/08/2016, 4:42 PM

## 2016-05-08 NOTE — ED Notes (Signed)
RT paged.

## 2016-05-08 NOTE — ED Triage Notes (Signed)
Per EMS: pt having sob and cough since this morning.  2 albuterol txs and 1 atrovent given by EMS.  Pt 99% on breathing tx at this time.  Pt alert and oriented.

## 2016-05-08 NOTE — ED Notes (Signed)
Pt completed with continuous neb

## 2016-05-08 NOTE — ED Provider Notes (Signed)
[place] DE[facility] Provider Note   CSN: [id number] Arrival date & time: 05/08/16  [id number] By signing my name below, I, Doran Stabler, attest that this documentation has been prepared under the direction and in the presence of Julianne Rice, MD. Electronically Signed: Doran Stabler, ED Scribe. 05/08/16. 9:47 AM.  History   Chief Complaint Chief Complaint  Patient presents with  . Shortness of Breath   The history is provided by the patient. No language interpreter was used.   HPI Comments: Linda Carroll is a 40 y.o. female who presents to the Emergency Department with a PMHx of asthma and bronchitis complaining of acute onset of SOB that began this morning. Pt also reports a yellow productive cough and wheezing. Pt states she ran out of her albuterol inhaler. En route, EMS administered Atrovent and Albuterol which she reports provided mild relief. Pt denies any fevers, chills, myalgias, leg swelling or any other symptoms at this time. Pt has never been hospitalized for asthma.  Past Medical History:  Diagnosis Date  . Asthma   . Bronchitis    Patient Active Problem List   Diagnosis Date Noted  . Asthma exacerbation 05/08/2016   History reviewed. No pertinent surgical history.  OB History    No data available     Home Medications    Prior to Admission medications   Medication Sig Start Date End Date Taking? Authorizing Provider  albuterol (PROVENTIL HFA;VENTOLIN HFA) 108 (90 Base) MCG/ACT inhaler Inhale 2 puffs into the lungs every 2 (two) hours as needed for wheezing or shortness of breath. 05/09/16   Erline Hau, MD  predniSONE (DELTASONE) 10 MG tablet Take 1 tablet (10 mg total) by mouth daily with breakfast. Take 6 tablets today and then decrease by 1 tablet daily until none are left. 05/09/16   Erline Hau, MD   Family History History reviewed. No pertinent family history.  Social History Social History  Substance Use Topics  .  Smoking status: Never Smoker  . Smokeless tobacco: Never Used  . Alcohol use No   Allergies   Peanuts [peanut oil]  Review of Systems Review of Systems  Constitutional: Negative for chills and fever.  HENT: Negative for congestion and sore throat.   Respiratory: Positive for cough, shortness of breath and wheezing.   Cardiovascular: Negative for chest pain and leg swelling.  Gastrointestinal: Negative for abdominal pain, diarrhea, nausea and vomiting.  Musculoskeletal: Negative for back pain, myalgias, neck pain and neck stiffness.  Neurological: Negative for dizziness, weakness, light-headedness and numbness.  All other systems reviewed and are negative.   Physical Exam Updated Vital Signs BP (!) 107/56 (BP Location: Right Arm)   Pulse 88   Temp 98.7 F (37.1 C) (Oral)   Resp 16   Ht 5\' 2"  (1.575 m)   Wt 185 lb 9.6 oz (84.2 kg)   LMP 05/01/2016 (Exact Date)   SpO2 100%   BMI 33.95 kg/m   Physical Exam  Constitutional: She is oriented to person, place, and time. She appears well-developed and well-nourished.  HENT:  Head: Normocephalic and atraumatic.  Mouth/Throat: Oropharynx is clear and moist.  Eyes: EOM are normal. Pupils are equal, round, and reactive to light.  Neck: Normal range of motion. Neck supple. No JVD present.  Cardiovascular: Regular rhythm.   Tachycardia  Pulmonary/Chest:  Increased work of breathing. Decreased breath sounds bilateral bases. Expiratory wheezing throughout.  Abdominal: Soft. Bowel sounds are normal. There is no tenderness. There is no rebound  and no guarding.  Musculoskeletal: Normal range of motion. She exhibits no edema or tenderness.  No lower extremity asymmetry, tenderness or swelling.  Lymphadenopathy:    She has no cervical adenopathy.  Neurological: She is alert and oriented to person, place, and time.  Awake and alert. Speaking in full sentences. Moving all extremities without deficit.  Skin: Skin is warm and dry. Capillary  refill takes less than 2 seconds. No rash noted. No erythema.  Psychiatric: She has a normal mood and affect. Her behavior is normal.  Nursing note and vitals reviewed.   ED Treatments / Results  DIAGNOSTIC STUDIES: Oxygen Saturation is 100% on room air, normal by my interpretation.    COORDINATION OF CARE: 9:47 AM Discussed treatment plan with pt at bedside and pt agreed to plan.  Labs (all labs ordered are listed, but only abnormal results are displayed) Labs Reviewed  CBC WITH DIFFERENTIAL/PLATELET - Abnormal; Notable for the following:       Result Value   RBC 5.40 (*)    Hemoglobin 11.7 (*)    MCV 69.6 (*)    MCH 21.7 (*)    All other components within normal limits  COMPREHENSIVE METABOLIC PANEL - Abnormal; Notable for the following:    Glucose, Bld 121 (*)    ALT 10 (*)    All other components within normal limits  BASIC METABOLIC PANEL - Abnormal; Notable for the following:    Glucose, Bld 137 (*)    Calcium 8.7 (*)    All other components within normal limits  CBC - Abnormal; Notable for the following:    WBC 13.4 (*)    Hemoglobin 10.4 (*)    HCT 32.9 (*)    MCV 69.6 (*)    MCH 22.0 (*)    All other components within normal limits    EKG  EKG Interpretation  Date/Time:  Friday May 08 2016 09:40:44 EST Ventricular Rate:  114 PR Interval:    QRS Duration: 74 QT Interval:  335 QTC Calculation: 462 R Axis:   80 Text Interpretation:  Atrial flutter with predominant 2:1 AV block Confirmed by Lita Mains  MD, Hesham Womac ([id number]) on 05/08/2016 9:55:25 AM      Radiology Dg Chest 2 View  Result Date: 05/08/2016 CLINICAL DATA:  Shortness of breath with exertion and productive cough since this morning, history asthma EXAM: CHEST  2 VIEW COMPARISON:  08/30/2014 FINDINGS: Normal heart size, mediastinal contours, and pulmonary vascularity. Lungs clear. No pleural effusion or pneumothorax. Bones unremarkable. IMPRESSION: Normal exam. Electronically Signed   By: Lavonia Dana M.D.   On: 05/08/2016 10:14    Procedures Procedures (including critical care time)  Medications Ordered in ED Medications  enoxaparin (LOVENOX) injection 40 mg (40 mg Subcutaneous Given 05/09/16 0023)  0.9 %  sodium chloride infusion (not administered)  acetaminophen (TYLENOL) tablet 650 mg (not administered)    Or  acetaminophen (TYLENOL) suppository 650 mg (not administered)  senna-docusate (Senokot-S) tablet 1 tablet (not administered)  ondansetron (ZOFRAN) tablet 4 mg (not administered)    Or  ondansetron (ZOFRAN) injection 4 mg (not administered)  methylPREDNISolone sodium succinate (SOLU-MEDROL) 125 mg/2 mL injection 60 mg (60 mg Intravenous Given 05/09/16 1239)  albuterol (PROVENTIL) (2.5 MG/3ML) 0.083% nebulizer solution 2.5 mg (2.5 mg Nebulization Given 05/08/16 2320)  methylPREDNISolone sodium succinate (SOLU-MEDROL) 125 mg/2 mL injection 125 mg (125 mg Intravenous Given 05/08/16 0958)  magnesium sulfate IVPB 2 g 50 mL (0 g Intravenous Stopped 05/08/16 1213)    Initial  Impression / Assessment and Plan / ED Course  I have reviewed the triage vital signs and the nursing notes.  Pertinent labs & imaging results that were available during my care of the patient were reviewed by me and considered in my medical decision making (see chart for details).  Clinical Course    Patient with persistent wheezing and dyspnea despite continuous nebulizers 2, Solu-Medrol and magnesium. We'll discuss with hospitalist regarding admission for observation.  Final Clinical Impressions(s) / ED Diagnoses   Final diagnoses:  Exacerbation of intermittent asthma, unspecified asthma severity   New Prescriptions Discharge Medication List as of 05/09/2016 12:34 PM    START taking these medications   Details  predniSONE (DELTASONE) 10 MG tablet Take 1 tablet (10 mg total) by mouth daily with breakfast. Take 6 tablets today and then decrease by 1 tablet daily until none are left., Starting  Sat 05/09/2016, Print       I personally performed the services described in this documentation, which was scribed in my presence. The recorded information has been reviewed and is accurate.      Julianne Rice, MD 05/09/16 [phone]

## 2016-05-09 DIAGNOSIS — J4521 Mild intermittent asthma with (acute) exacerbation: Secondary | ICD-10-CM | POA: Diagnosis not present

## 2016-05-09 LAB — CBC
HCT: 32.9 % — ABNORMAL LOW (ref 36.0–46.0)
HEMOGLOBIN: 10.4 g/dL — AB (ref 12.0–15.0)
MCH: 22 pg — AB (ref 26.0–34.0)
MCHC: 31.6 g/dL (ref 30.0–36.0)
MCV: 69.6 fL — AB (ref 78.0–100.0)
Platelets: 377 10*3/uL (ref 150–400)
RBC: 4.73 MIL/uL (ref 3.87–5.11)
RDW: 14.1 % (ref 11.5–15.5)
WBC: 13.4 10*3/uL — ABNORMAL HIGH (ref 4.0–10.5)

## 2016-05-09 LAB — BASIC METABOLIC PANEL
ANION GAP: 7 (ref 5–15)
BUN: 7 mg/dL (ref 6–20)
CHLORIDE: 106 mmol/L (ref 101–111)
CO2: 22 mmol/L (ref 22–32)
CREATININE: 0.64 mg/dL (ref 0.44–1.00)
Calcium: 8.7 mg/dL — ABNORMAL LOW (ref 8.9–10.3)
GFR calc non Af Amer: >60 mL/min (ref >60)
GLUCOSE: 137 mg/dL — AB (ref 65–99)
Potassium: 3.7 mmol/L (ref 3.5–5.1)
Sodium: 135 mmol/L (ref 135–145)

## 2016-05-09 MED ORDER — PREDNISONE 10 MG PO TABS: 10.0000 mg | ORAL_TABLET | Freq: Every day | ORAL | 0 refills | Status: DC

## 2016-05-09 MED ORDER — ALBUTEROL SULFATE HFA 108 (90 BASE) MCG/ACT IN AERS: 2.0000 | INHALATION_SPRAY | RESPIRATORY_TRACT | 11 refills | Status: DC | PRN

## 2016-05-09 NOTE — Discharge Summary (Signed)
Physician Discharge Summary  Linda Carroll [id number] DOB: 1976/01/07 DOA: 05/08/2016  PCP: Pcp Not In System  Admit date: 05/08/2016 Discharge date: 05/09/2016  Time spent: 45 minutes  Recommendations for Outpatient Follow-up:  -Will be discharged home today. -Advised to follow up with PCP in 2 wekks.   Discharge Diagnoses:  Principal Problem:   Asthma exacerbation   Discharge Condition: Stable and improved  Filed Weights   05/08/16 0936 05/08/16 1847  Weight: 83.9 kg (185 lb) 84.2 kg (185 lb 9.6 oz)    History of present illness:  Linda Carroll is a 40 y.o. female with h/o mild intermittent asthma that typically only flares up with a change in weather. She ran out of her albuterol MDI about 3 days ago. Since then has been having SOB that has progressively gotten worse. She received 2 continuous nebs in the ED and continued to be SOB, hence admission was requested. Labs are essentially WNL, CXR without acute findings.  Hospital Course:   Mild, Intermittent Asthma with acute exacerbation -Improved, no further wheezing. -Still SOB with ambulation. -Will DC home on a prednisone taper and albuterol MDI.  Sinus Tachycardia -resolved, likely due to beta agonists.   Procedures:  None   Consultations:  None  Discharge Instructions  Discharge Instructions    Increase activity slowly    Complete by:  As directed        Medication List    TAKE these medications   albuterol 108 (90 Base) MCG/ACT inhaler Commonly known as:  PROVENTIL HFA;VENTOLIN HFA Inhale 2 puffs into the lungs every 2 (two) hours as needed for wheezing or shortness of breath. What changed:  how much to take  when to take this   predniSONE 10 MG tablet Commonly known as:  DELTASONE Take 1 tablet (10 mg total) by mouth daily with breakfast. Take 6 tablets today and then decrease by 1 tablet daily until none are left.      Allergies  Allergen Reactions  . Peanuts [Peanut Oil]  Shortness Of Breath   Follow-up Information    with your regular PCP. Schedule an appointment as soon as possible for a visit in 2 week(s).            The results of significant diagnostics from this hospitalization (including imaging, microbiology, ancillary and laboratory) are listed below for reference.    Significant Diagnostic Studies: Dg Chest 2 View  Result Date: 05/08/2016 CLINICAL DATA:  Shortness of breath with exertion and productive cough since this morning, history asthma EXAM: CHEST  2 VIEW COMPARISON:  08/30/2014 FINDINGS: Normal heart size, mediastinal contours, and pulmonary vascularity. Lungs clear. No pleural effusion or pneumothorax. Bones unremarkable. IMPRESSION: Normal exam. Electronically Signed   By: Lavonia Dana M.D.   On: 05/08/2016 10:14    Microbiology: No results found for this or any previous visit (from the past 240 hour(s)).   Labs: Basic Metabolic Panel:  Recent Labs Lab 05/08/16 1122 05/09/16 0622  NA 139 135  K 3.5 3.7  CL 105 106  CO2 26 22  GLUCOSE 121* 137*  BUN 6 7  CREATININE 0.78 0.64  CALCIUM 9.1 8.7*   Liver Function Tests:  Recent Labs Lab 05/08/16 1122  AST 15  ALT 10*  ALKPHOS 54  BILITOT 0.8  PROT 7.9  ALBUMIN 3.9   No results for input(s): LIPASE, AMYLASE in the last 168 hours. No results for input(s): AMMONIA in the last 168 hours. CBC:  Recent Labs Lab  05/08/16 1122 05/09/16 0622  WBC 9.9 13.4*  NEUTROABS 6.9  --   HGB 11.7* 10.4*  HCT 37.6 32.9*  MCV 69.6* 69.6*  PLT 387 377   Cardiac Enzymes: No results for input(s): CKTOTAL, CKMB, CKMBINDEX, TROPONINI in the last 168 hours. BNP: BNP (last 3 results) No results for input(s): BNP in the last 8760 hours.  ProBNP (last 3 results) No results for input(s): PROBNP in the last 8760 hours.  CBG: No results for input(s): GLUCAP in the last 168 hours.     SignedLelon Frohlich  [facility] Pager: [phone] 05/09/2016, 4:30  PM

## 2016-07-09 ENCOUNTER — Ambulatory Visit (INDEPENDENT_AMBULATORY_CARE_PROVIDER_SITE_OTHER): Payer: Medicaid Other | Admitting: Family Medicine

## 2016-07-09 ENCOUNTER — Encounter: Payer: Self-pay | Admitting: Family Medicine

## 2016-07-09 VITALS — BP 124/82 | HR 88 | Temp 98.7°F | Resp 16 | Ht 63.0 in | Wt 191.0 lb

## 2016-07-09 DIAGNOSIS — Z13220 Encounter for screening for lipoid disorders: Secondary | ICD-10-CM

## 2016-07-09 DIAGNOSIS — Z23 Encounter for immunization: Secondary | ICD-10-CM

## 2016-07-09 DIAGNOSIS — Z7689 Persons encountering health services in other specified circumstances: Secondary | ICD-10-CM | POA: Diagnosis not present

## 2016-07-09 DIAGNOSIS — E66811 Obesity, class 1: Secondary | ICD-10-CM

## 2016-07-09 DIAGNOSIS — E6609 Other obesity due to excess calories: Secondary | ICD-10-CM

## 2016-07-09 DIAGNOSIS — J4540 Moderate persistent asthma, uncomplicated: Secondary | ICD-10-CM

## 2016-07-09 DIAGNOSIS — Z6833 Body mass index (BMI) 33.0-33.9, adult: Secondary | ICD-10-CM

## 2016-07-09 DIAGNOSIS — Z131 Encounter for screening for diabetes mellitus: Secondary | ICD-10-CM | POA: Diagnosis not present

## 2016-07-09 DIAGNOSIS — E669 Obesity, unspecified: Secondary | ICD-10-CM | POA: Insufficient documentation

## 2016-07-09 LAB — CBC
HEMATOCRIT: 36.7 % (ref 35.0–45.0)
HEMOGLOBIN: 10.9 g/dL — AB (ref 11.7–15.5)
MCH: 21.3 pg — ABNORMAL LOW (ref 27.0–33.0)
MCHC: 29.7 g/dL — AB (ref 32.0–36.0)
MCV: 71.8 fL — AB (ref 80.0–100.0)
MPV: 9.5 fL (ref 7.5–12.5)
Platelets: 393 10*3/uL (ref 140–400)
RBC: 5.11 MIL/uL — ABNORMAL HIGH (ref 3.80–5.10)
RDW: 14.1 % (ref 11.0–15.0)
WBC: 8.1 10*3/uL (ref 3.8–10.8)

## 2016-07-09 NOTE — Progress Notes (Addendum)
Chief Complaint  Patient presents with  . Establish Care   New to establish Prior care health department Needs flu and prevnar today History asthma Compliant with qvar and prn albuterol States is on disability for asthma Obese, discussed diet and exercise Pap this year- will get records  Patient Active Problem List   Diagnosis Date Noted  . Screening for diabetes mellitus (DM) 07/09/2016  . Need for lipid screening 07/09/2016  . Obesity 07/09/2016  . Encounter to establish care with new doctor 07/09/2016  . Asthma, chronic 05/08/2016    Outpatient Encounter Prescriptions as of 07/09/2016  Medication Sig  . albuterol (PROVENTIL HFA;VENTOLIN HFA) 108 (90 Base) MCG/ACT inhaler Inhale 2 puffs into the lungs every 2 (two) hours as needed for wheezing or shortness of breath.  . beclomethasone (QVAR) 80 MCG/ACT inhaler Inhale 1 puff into the lungs 2 (two) times daily.   No facility-administered encounter medications on file as of 07/09/2016.     Past Medical History:  Diagnosis Date  . Allergy   . Asthma   . Bronchitis     History reviewed. No pertinent surgical history.  Social History   Social History  . Marital status: Single    Spouse name: N/A  . Number of children: 1  . Years of education: 23   Occupational History  . disabled     asthma   Social History Main Topics  . Smoking status: Never Smoker  . Smokeless tobacco: Never Used  . Alcohol use No  . Drug use: No  . Sexual activity: Not Currently    Birth control/ protection: None   Other Topics Concern  . Not on file   Social History Narrative   Lives with daughter   Disabled from [place]   No car/never driven    Family History  Problem Relation Age of Onset  . Lupus Sister     Review of Systems  Constitutional: Negative for chills, fever and weight loss.  HENT: Negative for congestion and hearing loss.   Eyes: Negative for blurred vision and pain.  Respiratory: Negative for cough and  shortness of breath.   Cardiovascular: Negative for chest pain and leg swelling.  Gastrointestinal: Negative for abdominal pain, constipation, diarrhea and heartburn.  Genitourinary: Negative for dysuria and frequency.  Musculoskeletal: Negative for falls, joint pain and myalgias.  Neurological: Negative for dizziness, seizures and headaches.  Psychiatric/Behavioral: Negative for depression. The patient is not nervous/anxious and does not have insomnia.     BP 124/82 (BP Location: Right Arm, Patient Position: Sitting, Cuff Size: Normal)   Pulse 88   Temp 98.7 F (37.1 C) (Oral)   Resp 16   Ht 5\' 3"  (1.6 m)   Wt 191 lb 0.6 oz (86.7 kg)   LMP 07/05/2016 (Exact Date)   SpO2 100%   BMI 33.84 kg/m   Physical Exam  Constitutional: She is oriented to person, place, and time. She appears well-developed and well-nourished.  HENT:  Head: Normocephalic and atraumatic.  Right Ear: External ear normal.  Left Ear: External ear normal.  Mouth/Throat: Oropharynx is clear and moist.  Dental fractures and caries - molars  Eyes: Conjunctivae are normal. Pupils are equal, round, and reactive to light.  Neck: Normal range of motion. Neck supple. No thyromegaly present.  Cardiovascular: Normal rate, regular rhythm and normal heart sounds.   Pulmonary/Chest: Effort normal and breath sounds normal. No respiratory distress.  clear  Musculoskeletal: Normal range of motion. She exhibits no edema.  Lymphadenopathy:  She has no cervical adenopathy.  Neurological: She is alert and oriented to person, place, and time.  Gait normal  Skin: Skin is warm and dry.  Psychiatric: She has a normal mood and affect. Her behavior is normal.  Poor fund of knowledge.  Nursing note and vitals reviewed.  ASSESSMENT/PLAN:  1. Need for prophylactic vaccination and inoculation against influenza  - Flu Vaccine QUAD 36+ mos IM  2. Screening for diabetes mellitus (DM)  - COMPLETE METABOLIC PANEL WITH GFR  3.  Need for lipid screening  - Lipid panel  4. Encounter to establish care with new doctor   5. Moderate persistent asthma in adult without complication  - COMPLETE METABOLIC PANEL WITH GFR - Urinalysis, Routine w reflex microscopic - CBC  6. Class 1 obesity due to excess calories without serious comorbidity with body mass index (BMI) of 33.0 to 33.9 in adult    Patient Instructions  Need old records  Need to eat well and try to exercise  See me once a year    Raylene Everts, MD

## 2016-07-09 NOTE — Patient Instructions (Signed)
Need old records  Need to eat well and try to exercise  See me once a year

## 2016-07-10 ENCOUNTER — Encounter: Payer: Self-pay | Admitting: Family Medicine

## 2016-07-10 LAB — COMPLETE METABOLIC PANEL WITH GFR
ALBUMIN: 3.5 g/dL — AB (ref 3.6–5.1)
ALK PHOS: 55 U/L (ref 33–115)
ALT: 9 U/L (ref 6–29)
AST: 13 U/L (ref 10–30)
BUN: 8 mg/dL (ref 7–25)
CALCIUM: 8.8 mg/dL (ref 8.6–10.2)
CO2: 29 mmol/L (ref 20–31)
Chloride: 103 mmol/L (ref 98–110)
Creat: 0.89 mg/dL (ref 0.50–1.10)
GFR, EST NON AFRICAN AMERICAN: 82 mL/min (ref >=60)
Glucose, Bld: 105 mg/dL — ABNORMAL HIGH (ref 65–99)
POTASSIUM: 4.1 mmol/L (ref 3.5–5.3)
SODIUM: 140 mmol/L (ref 135–146)
Total Bilirubin: 0.5 mg/dL (ref 0.2–1.2)
Total Protein: 6.8 g/dL (ref 6.1–8.1)

## 2016-07-10 LAB — URINALYSIS, ROUTINE W REFLEX MICROSCOPIC
BILIRUBIN URINE: NEGATIVE
Glucose, UA: NEGATIVE
Leukocytes, UA: NEGATIVE
Nitrite: NEGATIVE
PH: 6 (ref 5.0–8.0)
Protein, ur: NEGATIVE
SPECIFIC GRAVITY, URINE: 1.025 (ref 1.001–1.035)

## 2016-07-10 LAB — LIPID PANEL
CHOLESTEROL: 140 mg/dL (ref <200)
HDL: 76 mg/dL (ref >50)
LDL Cholesterol: 48 mg/dL (ref <100)
TRIGLYCERIDES: 79 mg/dL (ref <150)
Total CHOL/HDL Ratio: 1.8 Ratio (ref <5.0)
VLDL: 16 mg/dL (ref <30)

## 2016-07-10 LAB — URINALYSIS, MICROSCOPIC ONLY
Bacteria, UA: NONE SEEN [HPF]
CASTS: NONE SEEN [LPF]
Crystals: NONE SEEN [HPF]
WBC, UA: NONE SEEN WBC/HPF (ref <=5)
YEAST: NONE SEEN [HPF]

## 2016-08-07 ENCOUNTER — Other Ambulatory Visit: Payer: Self-pay

## 2016-08-07 MED ORDER — ALBUTEROL SULFATE HFA 108 (90 BASE) MCG/ACT IN AERS: 2.0000 | INHALATION_SPRAY | RESPIRATORY_TRACT | 4 refills | Status: DC | PRN

## 2016-08-12 ENCOUNTER — Encounter: Payer: Self-pay | Admitting: Family Medicine

## 2016-08-12 DIAGNOSIS — D259 Leiomyoma of uterus, unspecified: Secondary | ICD-10-CM | POA: Insufficient documentation

## 2016-08-31 IMAGING — US US TRANSVAGINAL NON-OB
1 series · 13 of 25 positions shown · non-contrast
Comparison: None

CLINICAL DATA: Irregular and heavy uterine bleeding.



[Series 1: us transvaginal non-ob · 0.25mm/px · 13 of 92 slices shown]
[im 1/92]
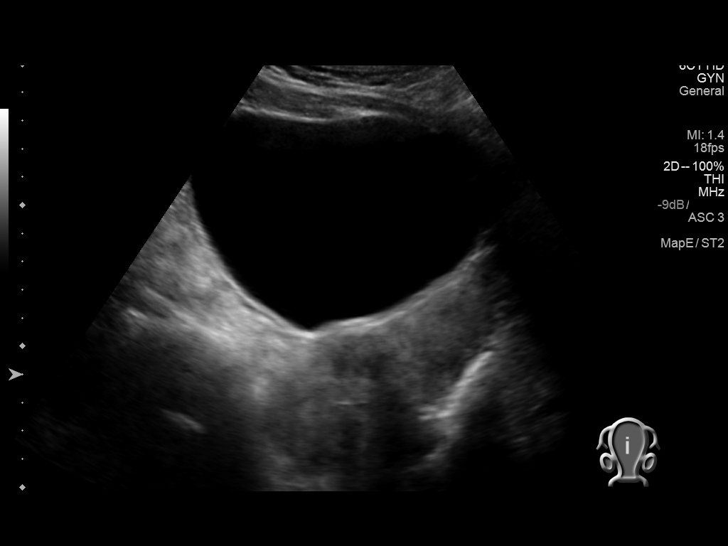
[im 8/92]
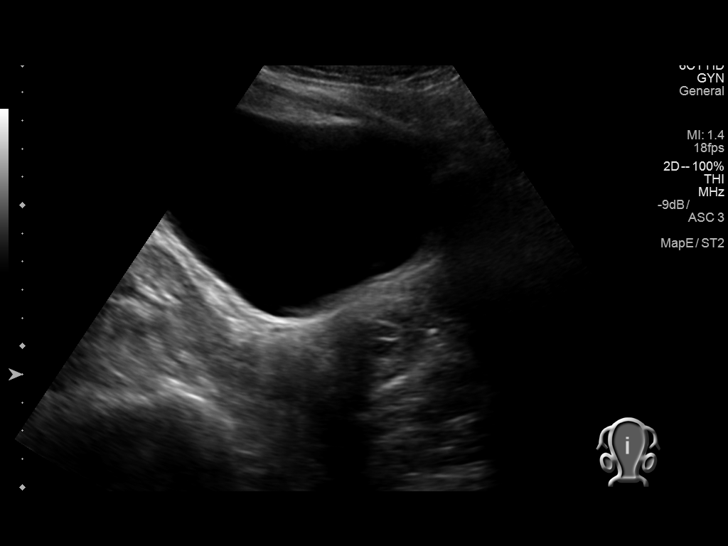
[im 16/92]
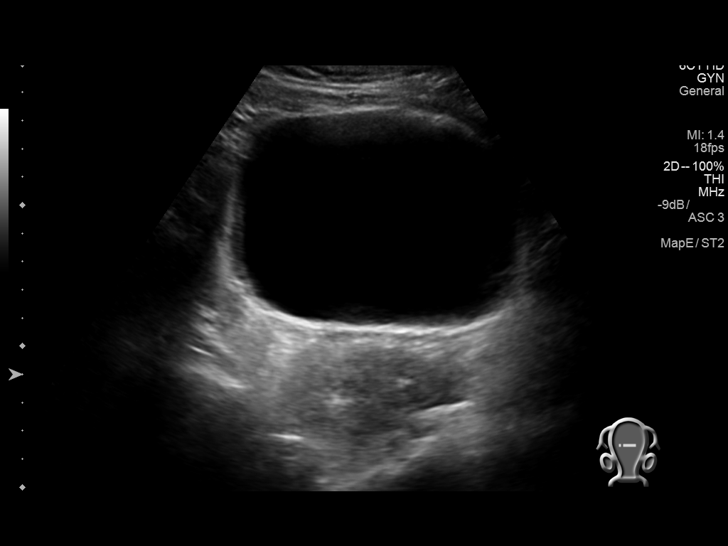
[im 23/92]
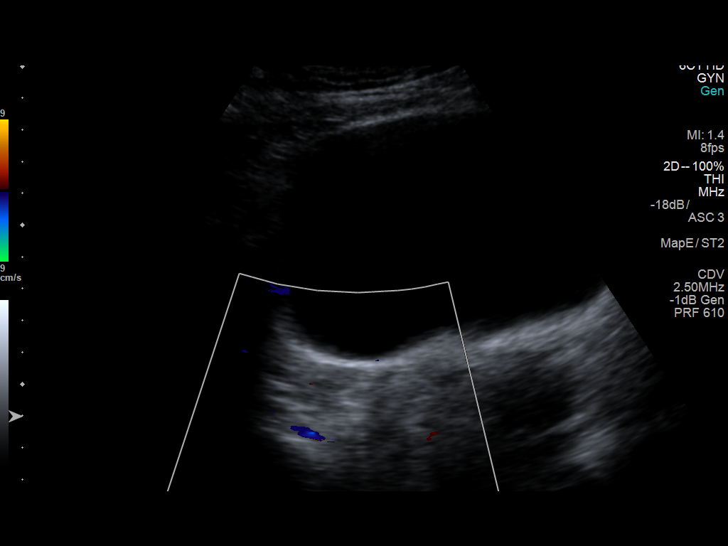
[im 31/92]
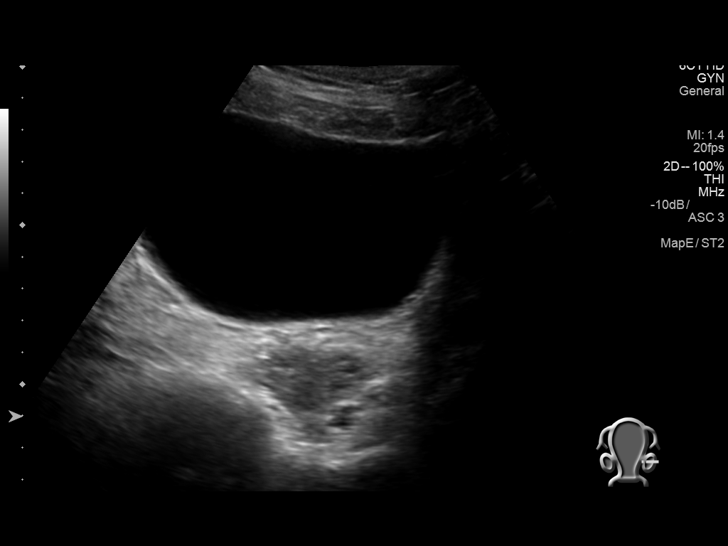
[im 38/92]
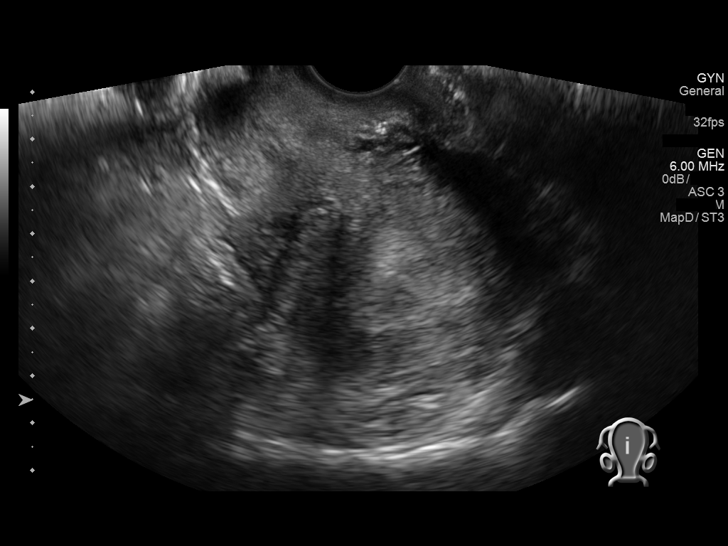
[im 46/92]
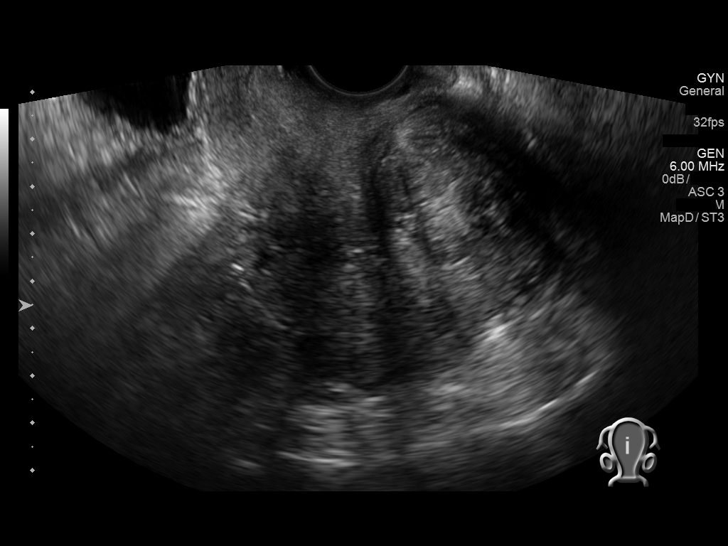
[im 54/92]
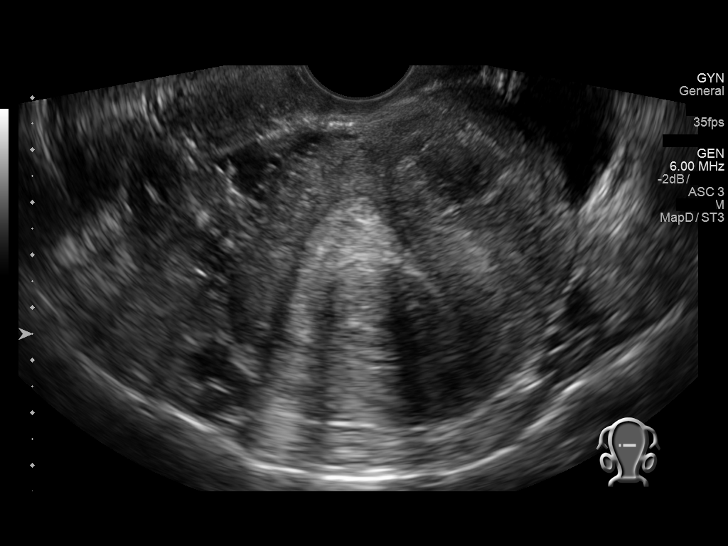
[im 61/92]
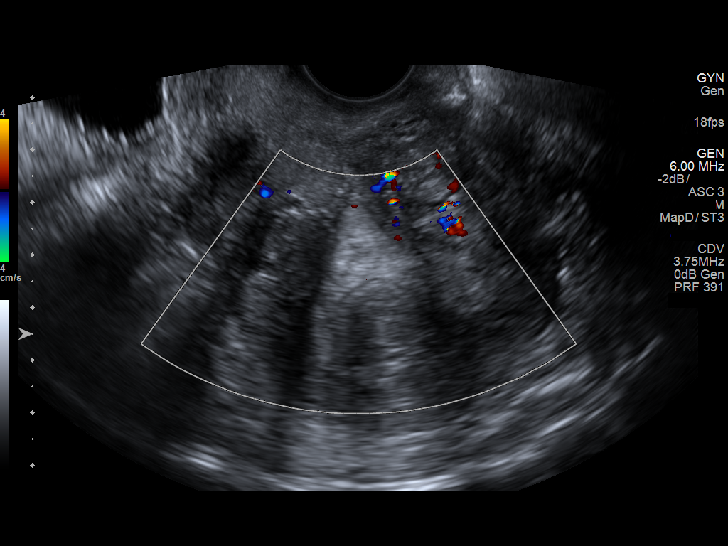
[im 69/92]
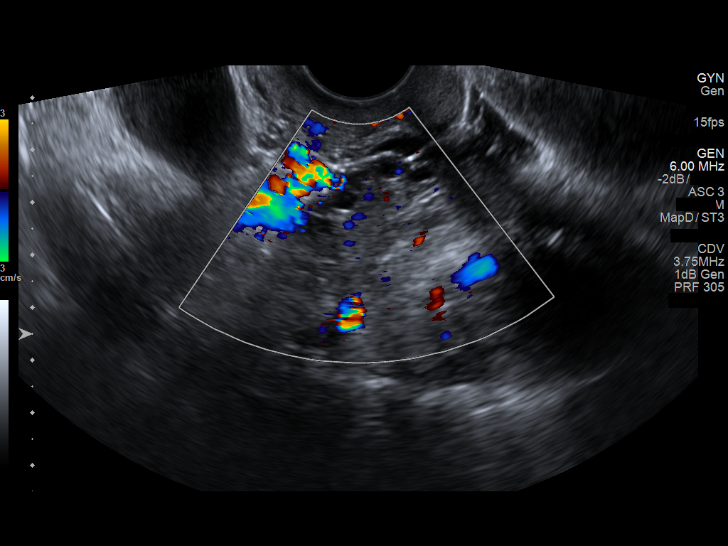
[im 76/92]
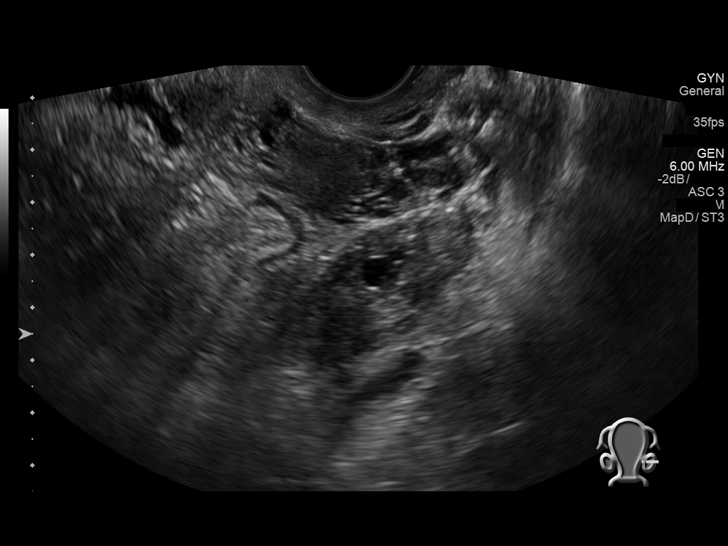
[im 84/92]
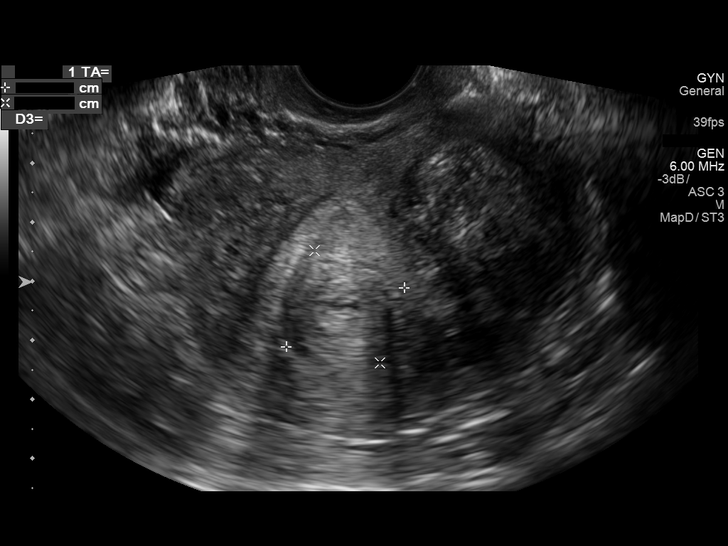
[im 92/92]
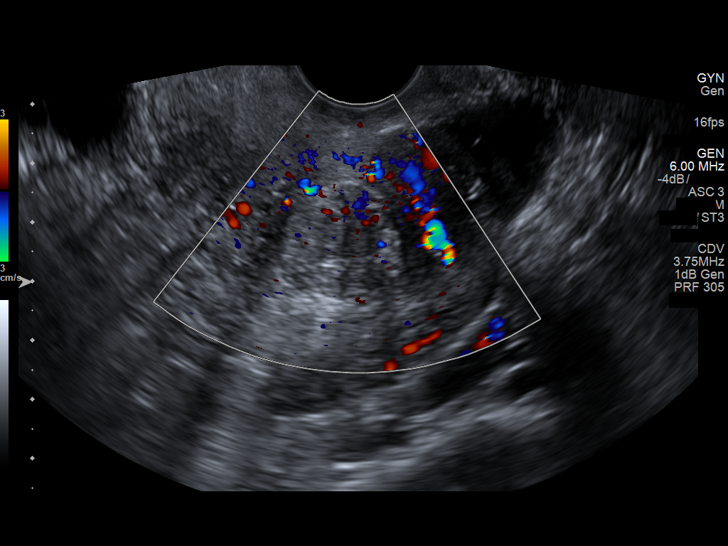

[13 of 25 positions shown; findings below may reference images not displayed]

FINDINGS: Uterus

Measurements: 8.9 x 6.4 x 6.1 cm. Three uterine fibroids are
identified. The largest is anterior and to the left in the lower
uterine segment measuring 3.8 cm in diameter. A more superior
fibroid which is also positioned anteriorly measures 2.4 cm in
diameter. Posterior fibroid in the miduterine segment measures
cm in diameter. This fibroid is subendometrial.

Endometrium

Thickness: 13 mm.  No focal abnormality visualized.

Right ovary

Measurements: 3.4 x 1.6 x 2.1 cm. Normal appearance/no adnexal mass.

Left ovary

Measurements: 4.2 x 2.1 x 3.3 cm. Normal appearance/no adnexal mass.

Other findings

Small volume of free pelvic fluid
IMPRESSION: Three uterine fibroids are identified including a subendometrial
fibroid measuring 2.5 cm in diameter which is posterior in the
miduterine segment.

Normal-appearing ovaries.

## 2016-09-03 ENCOUNTER — Encounter: Payer: Self-pay | Admitting: Family Medicine

## 2016-09-03 ENCOUNTER — Ambulatory Visit (INDEPENDENT_AMBULATORY_CARE_PROVIDER_SITE_OTHER): Payer: Medicaid Other | Admitting: Family Medicine

## 2016-09-03 VITALS — BP 136/80 | HR 72 | Temp 98.9°F | Resp 18 | Ht 63.0 in | Wt 190.1 lb

## 2016-09-03 DIAGNOSIS — J4541 Moderate persistent asthma with (acute) exacerbation: Secondary | ICD-10-CM | POA: Diagnosis not present

## 2016-09-03 DIAGNOSIS — J069 Acute upper respiratory infection, unspecified: Secondary | ICD-10-CM | POA: Diagnosis not present

## 2016-09-03 DIAGNOSIS — B9789 Other viral agents as the cause of diseases classified elsewhere: Secondary | ICD-10-CM

## 2016-09-03 MED ORDER — AZITHROMYCIN 250 MG PO TABS: ORAL_TABLET | ORAL | 0 refills | Status: DC

## 2016-09-03 MED ORDER — PREDNISONE 20 MG PO TABS: 20.0000 mg | ORAL_TABLET | Freq: Two times a day (BID) | ORAL | 0 refills | Status: DC

## 2016-09-03 MED ORDER — BENZONATATE 200 MG PO CAPS: 200.0000 mg | ORAL_CAPSULE | Freq: Two times a day (BID) | ORAL | 0 refills | Status: DC | PRN

## 2016-09-03 NOTE — Progress Notes (Signed)
Chief Complaint  Patient presents with  . URI    x 2 weeks   Patient has asthma. She has had exacerbation for the last 2 weeks. She is having sinus drainage, postnasal drip, and sore throat. She also has increased coughing and wheezing. She is coughing up yellow sputum. She states her symptoms are getting worse. She has developed some chest pain with coughing. In the last 2 days she's had some chills. She has not taken her temperature. Decreased appetite. She is trying to drink fluids.  Patient Active Problem List   Diagnosis Date Noted  . Uterine fibroid 08/12/2016  . Screening for diabetes mellitus (DM) 07/09/2016  . Need for lipid screening 07/09/2016  . Obesity 07/09/2016  . Encounter to establish care with new doctor 07/09/2016  . Asthma, chronic 05/08/2016    Outpatient Encounter Prescriptions as of 09/03/2016  Medication Sig  . albuterol (PROVENTIL HFA;VENTOLIN HFA) 108 (90 Base) MCG/ACT inhaler Inhale 2 puffs into the lungs every 2 (two) hours as needed for wheezing or shortness of breath.  . beclomethasone (QVAR) 80 MCG/ACT inhaler Inhale 1 puff into the lungs 2 (two) times daily.  Marland Kitchen azithromycin (ZITHROMAX) 250 MG tablet tad  . benzonatate (TESSALON) 200 MG capsule Take 1 capsule (200 mg total) by mouth 2 (two) times daily as needed for cough.  . predniSONE (DELTASONE) 20 MG tablet Take 1 tablet (20 mg total) by mouth 2 (two) times daily with a meal.   No facility-administered encounter medications on file as of 09/03/2016.     Allergies  Allergen Reactions  . Peanuts [Peanut Oil] Shortness Of Breath    Review of Systems  Constitutional: Positive for activity change, appetite change and chills. Negative for fever.  HENT: Positive for congestion, postnasal drip, rhinorrhea, sinus pain and sore throat.   Eyes: Negative for photophobia, redness and visual disturbance.  Respiratory: Positive for cough, shortness of breath and wheezing.   Cardiovascular: Positive for  chest pain. Negative for palpitations and leg swelling.  Gastrointestinal: Negative for nausea and vomiting.  Musculoskeletal: Negative for myalgias.  Neurological: Negative for dizziness and headaches.    BP 136/80 (BP Location: Right Arm, Patient Position: Sitting, Cuff Size: Normal)   Pulse 72   Temp 98.9 F (37.2 C) (Temporal)   Resp 18   Ht 5\' 3"  (1.6 m)   Wt 190 lb 1.9 oz (86.2 kg)   LMP 08/06/2016 (Approximate)   SpO2 100%   BMI 33.68 kg/m   Physical Exam  Constitutional: She is oriented to person, place, and time. She appears well-developed and well-nourished.  HENT:  Head: Normocephalic and atraumatic.  Right Ear: External ear normal.  Left Ear: External ear normal.  Mouth/Throat: Oropharynx is clear and moist.  Posterior pharynx mildly injected  Eyes: Conjunctivae are normal. Pupils are equal, round, and reactive to light.  Neck: Normal range of motion. Neck supple. No thyromegaly present.  Cardiovascular: Normal rate, regular rhythm and normal heart sounds.   Pulmonary/Chest: Effort normal and breath sounds normal. No respiratory distress.  Scattered wheezes in the bases, end inspiration  Abdominal: Soft. Bowel sounds are normal.  Lymphadenopathy:    She has no cervical adenopathy.  Neurological: She is alert and oriented to person, place, and time.  Gait normal  Skin: Skin is warm and dry.  Psychiatric: She has a normal mood and affect. Her behavior is normal. Thought content normal.  Nursing note and vitals reviewed.   ASSESSMENT/PLAN:  1. Viral URI with cough Since  patient has had symptoms for longer than 2 weeks and her symptoms are worsening, I am going to cover her with an antibiotic  2. Moderate persistent asthma with exacerbation 5 days of prednisone as prescribed   Patient Instructions  Take the antibiotic as directed Take the prednisone for 5 days Take the cough pills as needed Drink lots of water Call in not improving by next  week   Raylene Everts, MD

## 2016-09-03 NOTE — Patient Instructions (Signed)
Take the antibiotic as directed Take the prednisone for 5 days Take the cough pills as needed Drink lots of water Call in not improving by next week

## 2016-10-13 ENCOUNTER — Telehealth: Payer: Self-pay | Admitting: Family Medicine

## 2016-10-13 MED ORDER — ALBUTEROL SULFATE HFA 108 (90 BASE) MCG/ACT IN AERS: 2.0000 | INHALATION_SPRAY | RESPIRATORY_TRACT | 5 refills | Status: DC | PRN

## 2016-10-13 MED ORDER — BECLOMETHASONE DIPROPIONATE 80 MCG/ACT IN AERS: 1.0000 | INHALATION_SPRAY | Freq: Two times a day (BID) | RESPIRATORY_TRACT | 11 refills | Status: DC

## 2016-10-13 NOTE — Telephone Encounter (Signed)
Patient is requesting a refill of  qvar and bentolin ?  cb#: [phone] Uses [facility]

## 2016-10-15 ENCOUNTER — Other Ambulatory Visit: Payer: Self-pay

## 2016-10-15 MED ORDER — BUDESONIDE-FORMOTEROL FUMARATE 80-4.5 MCG/ACT IN AERO: 2.0000 | INHALATION_SPRAY | Freq: Two times a day (BID) | RESPIRATORY_TRACT | 5 refills | Status: DC

## 2016-10-15 MED ORDER — FLUTICASONE FUROATE-VILANTEROL 100-25 MCG/INH IN AEPB: 1.0000 | INHALATION_SPRAY | Freq: Every day | RESPIRATORY_TRACT | 11 refills | Status: DC

## 2016-10-22 ENCOUNTER — Other Ambulatory Visit: Payer: Self-pay

## 2016-11-11 ENCOUNTER — Telehealth: Payer: Self-pay | Admitting: Family Medicine

## 2016-11-11 NOTE — Telephone Encounter (Signed)
Patient is requesting a refill of ventolin  [facility] Cb#:[phone]

## 2016-11-11 NOTE — Telephone Encounter (Signed)
Called [facility], this was written on 5 1 18  for #1 with 5 refills.

## 2016-12-24 ENCOUNTER — Other Ambulatory Visit: Payer: Self-pay | Admitting: Family Medicine

## 2016-12-24 MED ORDER — ALBUTEROL SULFATE HFA 108 (90 BASE) MCG/ACT IN AERS: 2.0000 | INHALATION_SPRAY | RESPIRATORY_TRACT | 5 refills | Status: DC | PRN

## 2016-12-24 NOTE — Telephone Encounter (Signed)
Patient requesting Rx Proventil.    Please call in @ [facility]

## 2017-03-09 ENCOUNTER — Telehealth: Payer: Self-pay | Admitting: Family Medicine

## 2017-03-09 NOTE — Telephone Encounter (Signed)
Advised patient to call [facility] and ask them to fill the inhaler rx from July.  Patient understands.

## 2017-03-09 NOTE — Telephone Encounter (Signed)
Patient is requesting a refill on inhaler  cb# [phone]

## 2017-03-12 ENCOUNTER — Other Ambulatory Visit: Payer: Self-pay | Admitting: Family Medicine

## 2017-04-09 ENCOUNTER — Telehealth: Payer: Self-pay | Admitting: Family Medicine

## 2017-04-09 MED ORDER — ALBUTEROL SULFATE HFA 108 (90 BASE) MCG/ACT IN AERS: INHALATION_SPRAY | RESPIRATORY_TRACT | 5 refills | Status: DC

## 2017-04-09 MED ORDER — BUDESONIDE-FORMOTEROL FUMARATE 80-4.5 MCG/ACT IN AERO: 2.0000 | INHALATION_SPRAY | Freq: Two times a day (BID) | RESPIRATORY_TRACT | 5 refills | Status: DC

## 2017-04-09 NOTE — Telephone Encounter (Signed)
Requesting refill on both inhalers, send to [facility], cb# [phone]

## 2017-04-09 NOTE — Telephone Encounter (Signed)
Patient requesting that her inhalers Symbicort and Proventil be called in [place].  She did not have any refills.

## 2017-04-09 NOTE — Telephone Encounter (Signed)
Done

## 2017-05-01 ENCOUNTER — Other Ambulatory Visit: Payer: Self-pay

## 2017-05-01 ENCOUNTER — Encounter (HOSPITAL_COMMUNITY): Payer: Self-pay | Admitting: Emergency Medicine

## 2017-05-01 ENCOUNTER — Emergency Department (HOSPITAL_COMMUNITY)
Admission: EM | Admit: 2017-05-01 | Discharge: 2017-05-01 | Disposition: A | Discharge status: Home / Self Care | Payer: Medicaid Other | Attending: Emergency Medicine | Admitting: Emergency Medicine

## 2017-05-01 ENCOUNTER — Emergency Department (HOSPITAL_COMMUNITY): Payer: Medicaid Other

## 2017-05-01 DIAGNOSIS — J069 Acute upper respiratory infection, unspecified: Secondary | ICD-10-CM | POA: Diagnosis not present

## 2017-05-01 DIAGNOSIS — Z79899 Other long term (current) drug therapy: Secondary | ICD-10-CM | POA: Insufficient documentation

## 2017-05-01 DIAGNOSIS — R05 Cough: Secondary | ICD-10-CM | POA: Diagnosis present

## 2017-05-01 DIAGNOSIS — J40 Bronchitis, not specified as acute or chronic: Secondary | ICD-10-CM | POA: Insufficient documentation

## 2017-05-01 MED ORDER — PREDNISONE 50 MG PO TABS
50.0000 mg | ORAL_TABLET | Freq: Once | ORAL | Status: AC
  Administered 2017-05-01: 50 mg via ORAL
  Filled 2017-05-01: qty 1

## 2017-05-01 MED ORDER — DEXAMETHASONE 4 MG PO TABS: 4.0000 mg | ORAL_TABLET | Freq: Two times a day (BID) | ORAL | 0 refills | Status: DC

## 2017-05-01 MED ORDER — HYDROCODONE-HOMATROPINE 5-1.5 MG/5ML PO SYRP: 5.0000 mL | ORAL_SOLUTION | Freq: Four times a day (QID) | ORAL | 0 refills | Status: DC | PRN

## 2017-05-01 MED ORDER — ALBUTEROL SULFATE HFA 108 (90 BASE) MCG/ACT IN AERS
2.0000 | INHALATION_SPRAY | Freq: Once | RESPIRATORY_TRACT | Status: AC
  Administered 2017-05-01: 2 via RESPIRATORY_TRACT
  Filled 2017-05-01: qty 6.7

## 2017-05-01 MED ORDER — ONDANSETRON HCL 4 MG PO TABS
4.0000 mg | ORAL_TABLET | Freq: Once | ORAL | Status: AC
  Administered 2017-05-01: 4 mg via ORAL
  Filled 2017-05-01: qty 1

## 2017-05-01 MED ORDER — LORATADINE-PSEUDOEPHEDRINE ER 5-120 MG PO TB12: 1.0000 | ORAL_TABLET | Freq: Two times a day (BID) | ORAL | 0 refills | Status: DC

## 2017-05-01 MED ORDER — PSEUDOEPHEDRINE HCL 60 MG PO TABS
60.0000 mg | ORAL_TABLET | Freq: Once | ORAL | Status: AC
  Administered 2017-05-01: 60 mg via ORAL
  Filled 2017-05-01: qty 1

## 2017-05-01 NOTE — ED Provider Notes (Signed)
[facility] EMERGENCY DEPARTMENT Provider Note   CSN: [id number] Arrival date & time: 05/01/17  1856     History   Chief Complaint Chief Complaint  Patient presents with  . Cough    HPI Linda Carroll is a 41 y.o. female.  Patient is a 41 year old female who presents to the emergency department with a complaint of cough and congestion.  The patient states that she has been feeling bad a couple of days, but has not had fever or chills or felt really bad until today.  She states she had a flu shot around 1 PM.  Since that time she is noticed that she felt even worse.  She has a productive cough.  Patient has a history of asthma and bronchitis.  She states that she has been out of her medication for asthma.  No recent injury to the chest.  She presents to the emergency department now for assistance with these problems.      Past Medical History:  Diagnosis Date  . Allergy   . Asthma   . Bronchitis     Patient Active Problem List   Diagnosis Date Noted  . Uterine fibroid 08/12/2016  . Screening for diabetes mellitus (DM) 07/09/2016  . Need for lipid screening 07/09/2016  . Obesity 07/09/2016  . Encounter to establish care with new doctor 07/09/2016  . Asthma, chronic 05/08/2016    History reviewed. No pertinent surgical history.  OB History    No data available       Home Medications    Prior to Admission medications   Medication Sig Start Date End Date Taking? Authorizing Provider  albuterol (PROVENTIL HFA) 108 (90 Base) MCG/ACT inhaler INHALE 2 PUFFS INTO LUNGS EVERY 4 HOURS AS NEEDED FOR WHEEZING OR SHORTNESS OF BREATH 04/09/17   Raylene Everts, MD  azithromycin [facility]) 250 MG tablet tad 09/03/16   Raylene Everts, MD  benzonatate (TESSALON) 200 MG capsule Take 1 capsule (200 mg total) by mouth 2 (two) times daily as needed for cough. 09/03/16   Raylene Everts, MD  budesonide-formoterol [facility]) 80-4.5 MCG/ACT inhaler Inhale 2 puffs into the  lungs 2 (two) times daily. 04/09/17   Raylene Everts, MD  predniSONE (DELTASONE) 20 MG tablet Take 1 tablet (20 mg total) by mouth 2 (two) times daily with a meal. 09/03/16   Raylene Everts, MD    Family History Family History  Problem Relation Age of Onset  . Lupus Sister   . Kidney disease Sister        dialysis - lupus    Social History Social History   Tobacco Use  . Smoking status: Never Smoker  . Smokeless tobacco: Never Used  Substance Use Topics  . Alcohol use: No  . Drug use: No     Allergies   Peanuts [peanut oil]   Review of Systems Review of Systems  Constitutional: Positive for chills. Negative for activity change.       All ROS Neg except as noted in HPI  HENT: Positive for congestion. Negative for nosebleeds.   Eyes: Negative for photophobia and discharge.  Respiratory: Positive for cough and wheezing. Negative for shortness of breath.   Cardiovascular: Negative for chest pain and palpitations.  Gastrointestinal: Negative for abdominal pain and blood in stool.  Genitourinary: Negative for dysuria, frequency and hematuria.  Musculoskeletal: Positive for myalgias. Negative for arthralgias, back pain and neck pain.  Skin: Negative.   Neurological: Negative for dizziness, seizures and speech  difficulty.  Psychiatric/Behavioral: Negative for confusion and hallucinations.     Physical Exam Updated Vital Signs BP 130/61 (BP Location: Left Arm)   Pulse 78   Temp 98.4 F (36.9 C) (Oral)   Resp 18   Ht 5' (1.524 m)   Wt 92.2 kg (203 lb 5 oz)   LMP 04/29/2017   SpO2 100%   BMI 39.71 kg/m   Physical Exam  Constitutional: She is oriented to person, place, and time. She appears well-developed and well-nourished.  Non-toxic appearance.  HENT:  Head: Normocephalic.  Right Ear: Tympanic membrane and external ear normal.  Left Ear: Tympanic membrane and external ear normal.  Nasal congestion present.  Eyes: EOM and lids are normal. Pupils are  equal, round, and reactive to light.  Neck: Normal range of motion. Neck supple. Carotid bruit is not present.  Cardiovascular: Normal rate, regular rhythm, normal heart sounds, intact distal pulses and normal pulses.  Pulmonary/Chest: No respiratory distress. She has wheezes.  There is symmetrical rise and fall of the chest.  Patient speaks in complete sentences without problem.  Abdominal: Soft. Bowel sounds are normal. There is no tenderness. There is no guarding.  Musculoskeletal: Normal range of motion. She exhibits no edema.  Lymphadenopathy:       Head (right side): No submandibular adenopathy present.       Head (left side): No submandibular adenopathy present.    She has no cervical adenopathy.  Neurological: She is alert and oriented to person, place, and time. She has normal strength. No cranial nerve deficit or sensory deficit.  Skin: Skin is warm and dry.  Psychiatric: She has a normal mood and affect. Her speech is normal.  Nursing note and vitals reviewed.    ED Treatments / Results  Labs (all labs ordered are listed, but only abnormal results are displayed) Labs Reviewed - No data to display  EKG  EKG Interpretation None       Radiology Dg Chest 2 View  Result Date: 05/01/2017 CLINICAL DATA:  Cough. EXAM: CHEST  2 VIEW COMPARISON:  May 08, 2016 FINDINGS: The heart size and mediastinal contours are within normal limits. Both lungs are clear. The visualized skeletal structures are unremarkable. IMPRESSION: No active cardiopulmonary disease. Electronically Signed   By: Dorise Bullion III M.D   On: 05/01/2017 19:36    Procedures Procedures (including critical care time)  Medications Ordered in ED Medications - No data to display   Initial Impression / Assessment and Plan / ED Course  I have reviewed the triage vital signs and the nursing notes.  Pertinent labs & imaging results that were available during my care of the patient were reviewed by me and  considered in my medical decision making (see chart for details).       Final Clinical Impressions(s) / ED Diagnoses MDM Vital signs within normal limits.  Chest x-ray shows no active cardiopulmonary disease.  Pulse oximetry is 100% on room air.  Patient has nasal congestion as well as some wheezing present during examination.  Patient will be treated with a short course of steroid as well as an albuterol inhaler.  Patient will use decongestant of choice.  Tylenol, and/or ibuprofen for soreness.  We discussed the importance of using a mask, washing hands frequently, and increasing fluids.  Patient is in agreement with this plan.   Final diagnoses:  Bronchitis  Acute upper respiratory infection    ED Discharge Orders        Ordered  dexamethasone (DECADRON) 4 MG tablet  2 times daily with meals     05/01/17 2052    loratadine-pseudoephedrine (CLARITIN-D 12 HOUR) 5-120 MG tablet  2 times daily     05/01/17 2052    HYDROcodone-homatropine (HYCODAN) 5-1.5 MG/5ML syrup  Every 6 hours PRN     05/01/17 2100       Lily Kocher, PA-C 05/02/17 2031    Noemi Chapel, MD 05/03/17 1447

## 2017-05-01 NOTE — ED Triage Notes (Signed)
Pt reports having a cough that started today. Pt reports she has felt bad until today. Pt sounds congested and states when she coughs, she feels sob.

## 2017-05-01 NOTE — ED Notes (Signed)
Pt got a flu shot today around 1pm.

## 2017-05-01 NOTE — Discharge Instructions (Addendum)
Please use the albuterol inhaler 2 puffs every 4 hours.  Please use Decadron 2 times daily with food use Claritin-D every 12 hours for congestion.  Please increase fluids.  Please wash hands frequently.  Use a mask until symptoms have resolved.  See Dr. Meda Coffee for additional evaluation and management if not improving.

## 2017-05-05 ENCOUNTER — Encounter: Payer: Self-pay | Admitting: Family Medicine

## 2017-05-05 ENCOUNTER — Ambulatory Visit (INDEPENDENT_AMBULATORY_CARE_PROVIDER_SITE_OTHER): Payer: Medicaid Other | Admitting: Family Medicine

## 2017-05-05 ENCOUNTER — Other Ambulatory Visit: Payer: Self-pay

## 2017-05-05 VITALS — BP 134/80 | HR 84 | Temp 98.9°F | Resp 18 | Ht 63.0 in | Wt 204.1 lb

## 2017-05-05 DIAGNOSIS — J4541 Moderate persistent asthma with (acute) exacerbation: Secondary | ICD-10-CM

## 2017-05-05 MED ORDER — METHYLPREDNISOLONE ACETATE 40 MG/ML IJ SUSP: 80.0000 mg | Freq: Once | INTRAMUSCULAR | Status: DC

## 2017-05-05 MED ORDER — METHYLPREDNISOLONE ACETATE 80 MG/ML IJ SUSP
80.0000 mg | Freq: Once | INTRAMUSCULAR | Status: AC
  Administered 2017-05-05: 80 mg via INTRAMUSCULAR

## 2017-05-05 MED ORDER — IPRATROPIUM BROMIDE 0.02 % IN SOLN
0.5000 mg | Freq: Once | RESPIRATORY_TRACT | Status: AC
  Administered 2017-05-05: 0.5 mg via RESPIRATORY_TRACT

## 2017-05-05 MED ORDER — PREDNISONE 20 MG PO TABS: 20.0000 mg | ORAL_TABLET | Freq: Two times a day (BID) | ORAL | 0 refills | Status: DC

## 2017-05-05 MED ORDER — ALBUTEROL SULFATE (2.5 MG/3ML) 0.083% IN NEBU
2.5000 mg | INHALATION_SOLUTION | Freq: Once | RESPIRATORY_TRACT | Status: AC
  Administered 2017-05-05: 2.5 mg via RESPIRATORY_TRACT

## 2017-05-05 NOTE — Progress Notes (Signed)
Chief Complaint  Patient presents with  . URI    AP ED 11 17   Patient is here for emergency room follow-up.  She went to [facility] emergency room on 05/01/2017.  She has asthma with an exacerbation.  She was very short of breath.  She was treated with inhalers.  She was given prescriptions for prednisone and cough medication.  She states she could not afford to pick them up.  She has been using her usual Symbicort and albuterol at home.  She still is very short of breath.  She is coughing.  No sputum.  No fevers.  No runny or stuffy nose.  No nausea or vomiting.  She is trying to keep up with her fluids.  Patient Active Problem List   Diagnosis Date Noted  . Uterine fibroid 08/12/2016  . Screening for diabetes mellitus (DM) 07/09/2016  . Need for lipid screening 07/09/2016  . Obesity 07/09/2016  . Encounter to establish care with new doctor 07/09/2016  . Asthma, chronic 05/08/2016    Outpatient Encounter Medications as of 05/05/2017  Medication Sig  . albuterol (PROVENTIL HFA) 108 (90 Base) MCG/ACT inhaler INHALE 2 PUFFS INTO LUNGS EVERY 4 HOURS AS NEEDED FOR WHEEZING OR SHORTNESS OF BREATH  . budesonide-formoterol (SYMBICORT) 80-4.5 MCG/ACT inhaler Inhale 2 puffs into the lungs 2 (two) times daily.  . predniSONE (DELTASONE) 20 MG tablet Take 1 tablet (20 mg total) by mouth 2 (two) times daily with a meal.   No facility-administered encounter medications on file as of 05/05/2017.     Allergies  Allergen Reactions  . Peanuts [Peanut Oil] Shortness Of Breath    Review of Systems  Constitutional: Positive for activity change, appetite change and fatigue. Negative for chills and fever.  HENT: Negative for congestion, postnasal drip, rhinorrhea, sinus pressure and sinus pain.   Eyes: Negative for redness and visual disturbance.  Respiratory: Positive for cough, chest tightness, shortness of breath and wheezing.   Cardiovascular: Negative for chest pain, palpitations and leg  swelling.  Gastrointestinal: Negative for diarrhea, nausea and vomiting.  Genitourinary: Negative for difficulty urinating and frequency.  Musculoskeletal: Negative for arthralgias and back pain.  Neurological: Negative for dizziness and headaches.  Psychiatric/Behavioral: Positive for sleep disturbance.    BP 134/80 (BP Location: Right Arm, Patient Position: Sitting, Cuff Size: Large)   Pulse 84   Temp 98.9 F (37.2 C) (Temporal)   Resp 18   Ht 5\' 3"  (1.6 m)   Wt 204 lb 1.9 oz (92.6 kg)   LMP 04/29/2017   SpO2 100%   BMI 36.16 kg/m   Physical Exam  Constitutional: She is oriented to person, place, and time. She appears well-developed and well-nourished.  HENT:  Head: Normocephalic and atraumatic.  Right Ear: External ear normal.  Left Ear: External ear normal.  Mouth/Throat: Oropharynx is clear and moist.  Posterior pharynx mildly injected  Eyes: Conjunctivae are normal. Pupils are equal, round, and reactive to light.  Neck: Normal range of motion. Neck supple. No thyromegaly present.  Cardiovascular: Normal rate, regular rhythm and normal heart sounds.  Pulmonary/Chest: Effort normal. No respiratory distress. She has wheezes.  Wheezing throughout both lung fields, mild tachypnea.  After nebulizer treatment, improvement.  Few scattered wheeze remained.  No rales  Abdominal: Soft. Bowel sounds are normal.  Lymphadenopathy:    She has no cervical adenopathy.  Neurological: She is alert and oriented to person, place, and time.  Gait normal  Skin: Skin is warm and dry.  Psychiatric: She has a normal mood and affect. Her behavior is normal. Thought content normal.  Nursing note and vitals reviewed.   ASSESSMENT/PLAN:  1. Moderate persistent asthma with exacerbation  - methylPREDNISolone acetate (DEPO-MEDROL) injection 80 mg - albuterol (PROVENTIL) (2.5 MG/3ML) 0.083% nebulizer solution 2.5 mg - ipratropium (ATROVENT) nebulizer solution 0.5 mg   Patient Instructions    Take the prednisone as directed Push fluids Use nebulizer every 4 hours as needed Use inhaler only if away from home See me next week in follow up   Raylene Everts, MD

## 2017-05-05 NOTE — Patient Instructions (Signed)
Take the prednisone as directed Push fluids Use nebulizer every 4 hours as needed Use inhaler only if away from home See me next week in follow up

## 2017-05-10 ENCOUNTER — Encounter: Payer: Self-pay | Admitting: Family Medicine

## 2017-05-10 ENCOUNTER — Other Ambulatory Visit: Payer: Self-pay

## 2017-05-10 ENCOUNTER — Ambulatory Visit (INDEPENDENT_AMBULATORY_CARE_PROVIDER_SITE_OTHER): Payer: Medicaid Other | Admitting: Family Medicine

## 2017-05-10 VITALS — BP 152/90 | HR 72 | Temp 97.9°F | Resp 16 | Ht 63.0 in | Wt 201.1 lb

## 2017-05-10 DIAGNOSIS — J4541 Moderate persistent asthma with (acute) exacerbation: Secondary | ICD-10-CM

## 2017-05-10 DIAGNOSIS — R030 Elevated blood-pressure reading, without diagnosis of hypertension: Secondary | ICD-10-CM

## 2017-05-10 MED ORDER — PREDNISONE 20 MG PO TABS: 20.0000 mg | ORAL_TABLET | Freq: Two times a day (BID) | ORAL | 0 refills | Status: DC

## 2017-05-10 NOTE — Patient Instructions (Signed)
I have refilled the prednisone This is for emergency use  1. Use daily inhaler - symbicort 2. If short of breath add the rescue inhaler ( albuterol) 3. If still short of breath use the nebullizer ( breathing machine) 4. If not better call the office 5. If we are not open or available start yourself on the prednisone  At any time you have true emergency , cannot breathe, go to ER

## 2017-05-10 NOTE — Progress Notes (Signed)
Chief Complaint  Patient presents with  . Follow-up    1 week uri   Patient was seen a week ago for an exacerbation of her asthma.  She was quite short of breath at that time.  She had been to the emergency room but was unable to afford her medication.  I gave her a shot of prednisone, prednisone, and discussed with her inhaler use.  She is here today 1 week later for follow-up. 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  Her asthma action plan is written for her, and discussed with she and her sister. Today she is back for follow-up and feels back to her baseline.  In fact, she states she feels quite good Her blood pressure is elevated on her at her initial evaluation, came down with time.  I think this may be from her steroid use.  It has not been elevated before.  We will follow her elevated blood pressure over time.  She is advised to limit her salt intake.  Patient Active Problem List   Diagnosis Date Noted  . Uterine fibroid 08/12/2016  . Screening for diabetes mellitus (DM) 07/09/2016  . Need for lipid screening 07/09/2016  . Obesity 07/09/2016  . Encounter to establish care with new doctor 07/09/2016  . Asthma, chronic 05/08/2016    Outpatient Encounter Medications as of 05/10/2017  Medication Sig  . albuterol (PROVENTIL HFA) 108 (90 Base) MCG/ACT inhaler INHALE 2 PUFFS INTO LUNGS EVERY 4 HOURS AS NEEDED FOR WHEEZING OR SHORTNESS OF BREATH  . budesonide-formoterol (SYMBICORT) 80-4.5 MCG/ACT inhaler Inhale 2 puffs into the lungs 2 (two) times daily.  . predniSONE (DELTASONE) 20 MG tablet Take 1 tablet (20 mg total) by mouth 2 (two) times daily with a meal.  . [DISCONTINUED] predniSONE (DELTASONE) 20 MG tablet Take 1 tablet (20 mg total) by mouth 2 (two) times daily with a meal.   No facility-administered encounter medications on file as of 05/10/2017.     Allergies  Allergen Reactions  . Peanuts [Peanut Oil] Shortness Of Breath    Review  of Systems  Constitutional: Negative for activity change, appetite change and unexpected weight change.  HENT: Negative for congestion, dental problem, postnasal drip and rhinorrhea.   Eyes: Negative for redness and visual disturbance.  Respiratory: Negative for cough and shortness of breath.   Cardiovascular: Negative for chest pain, palpitations and leg swelling.  Gastrointestinal: Negative for abdominal pain, constipation and diarrhea.  Genitourinary: Negative for difficulty urinating and frequency.  Musculoskeletal: Negative for arthralgias and back pain.  Neurological: Negative for dizziness and headaches.  Psychiatric/Behavioral: Negative for dysphoric mood and sleep disturbance. The patient is not nervous/anxious.    BP (!) 152/90 (BP Location: Right Arm, Patient Position: Sitting, Cuff Size: Normal)   Pulse 72   Temp 97.9 F (36.6 C) (Temporal)   Resp 16   Ht 5\' 3"  (1.6 m)   Wt 201 lb 1.9 oz (91.2 kg)   LMP 04/29/2017   SpO2 99%   BMI 35.63 kg/m   Physical Exam  Constitutional: She is oriented to person, place, and time. She appears well-developed and well-nourished.  Obese  HENT:  Head: Normocephalic and atraumatic.  Right Ear: External ear normal.  Left Ear: External ear normal.  Mouth/Throat: Oropharynx is clear and moist.  Eyes: Conjunctivae are normal. Pupils are equal, round, and reactive to light.  Neck: Normal range of motion. Neck supple. No thyromegaly present.  Cardiovascular: Normal rate, regular  rhythm and normal heart sounds.  Pulmonary/Chest: Effort normal and breath sounds normal. No respiratory distress.  Lungs are clear  Lymphadenopathy:    She has no cervical adenopathy.  Neurological: She is alert and oriented to person, place, and time.  Gait normal  Skin: Skin is warm and dry.  Psychiatric: She has a normal mood and affect. Her behavior is normal. Thought content normal.  Nursing note and vitals reviewed.   ASSESSMENT/PLAN:  1. Moderate  persistent asthma with exacerbation Exacerbation, cleared  2. Elevated blood-pressure reading, without diagnosis of hypertension We will follow   Patient Instructions  I have refilled the prednisone This is for emergency use  1. Use daily inhaler - symbicort 2. If short of breath add the rescue inhaler ( albuterol) 3. If still short of breath use the nebullizer ( breathing machine) 4. If not better call the office 5. If we are not open or available start yourself on the prednisone  At any time you have true emergency , cannot breathe, go to ER    Raylene Everts, MD

## 2017-06-29 ENCOUNTER — Telehealth: Payer: Self-pay

## 2017-06-29 MED ORDER — ALBUTEROL SULFATE HFA 108 (90 BASE) MCG/ACT IN AERS: INHALATION_SPRAY | RESPIRATORY_TRACT | 5 refills | Status: DC

## 2017-06-29 NOTE — Telephone Encounter (Signed)
Needs refill on [place] inhaler please call in

## 2017-06-29 NOTE — Telephone Encounter (Signed)
Seen 11 [phone] 18

## 2017-08-23 ENCOUNTER — Encounter: Payer: Self-pay | Admitting: Family Medicine

## 2017-08-30 ENCOUNTER — Telehealth: Payer: Self-pay | Admitting: Family Medicine

## 2017-08-30 MED ORDER — ALBUTEROL SULFATE HFA 108 (90 BASE) MCG/ACT IN AERS: INHALATION_SPRAY | RESPIRATORY_TRACT | 0 refills | Status: DC

## 2017-08-30 MED ORDER — BUDESONIDE-FORMOTEROL FUMARATE 80-4.5 MCG/ACT IN AERO: 2.0000 | INHALATION_SPRAY | Freq: Two times a day (BID) | RESPIRATORY_TRACT | 0 refills | Status: DC

## 2017-08-30 NOTE — Telephone Encounter (Signed)
PT left another msg regarding her inhalers

## 2017-08-30 NOTE — Telephone Encounter (Signed)
Patient called in to request a refill on both inhalers. Uses [facility] pharmacy. Cb#: [phone]

## 2017-08-31 ENCOUNTER — Other Ambulatory Visit: Payer: Self-pay | Admitting: Family Medicine

## 2017-08-31 NOTE — Telephone Encounter (Signed)
This has been done.

## 2017-09-09 ENCOUNTER — Other Ambulatory Visit: Payer: Self-pay | Admitting: Family Medicine

## 2017-09-10 ENCOUNTER — Other Ambulatory Visit: Payer: Self-pay

## 2017-09-10 MED ORDER — ALBUTEROL SULFATE HFA 108 (90 BASE) MCG/ACT IN AERS: INHALATION_SPRAY | RESPIRATORY_TRACT | 0 refills | Status: DC

## 2017-09-20 ENCOUNTER — Ambulatory Visit (INDEPENDENT_AMBULATORY_CARE_PROVIDER_SITE_OTHER): Payer: Medicaid Other | Admitting: Family Medicine

## 2017-09-20 ENCOUNTER — Encounter: Payer: Self-pay | Admitting: Family Medicine

## 2017-09-20 VITALS — BP 132/82 | HR 65 | Resp 16 | Ht 63.0 in | Wt 204.0 lb

## 2017-09-20 DIAGNOSIS — Z23 Encounter for immunization: Secondary | ICD-10-CM | POA: Diagnosis not present

## 2017-09-20 DIAGNOSIS — E6609 Other obesity due to excess calories: Secondary | ICD-10-CM | POA: Diagnosis not present

## 2017-09-20 DIAGNOSIS — Z6835 Body mass index (BMI) 35.0-35.9, adult: Secondary | ICD-10-CM | POA: Diagnosis not present

## 2017-09-20 DIAGNOSIS — J4541 Moderate persistent asthma with (acute) exacerbation: Secondary | ICD-10-CM

## 2017-09-20 DIAGNOSIS — J45909 Unspecified asthma, uncomplicated: Secondary | ICD-10-CM | POA: Diagnosis not present

## 2017-09-20 MED ORDER — ALBUTEROL SULFATE HFA 108 (90 BASE) MCG/ACT IN AERS: INHALATION_SPRAY | RESPIRATORY_TRACT | 1 refills | Status: DC

## 2017-09-20 MED ORDER — MONTELUKAST SODIUM 10 MG PO TABS: 10.0000 mg | ORAL_TABLET | Freq: Every day | ORAL | 1 refills | Status: DC

## 2017-09-20 MED ORDER — BUDESONIDE-FORMOTEROL FUMARATE 80-4.5 MCG/ACT IN AERO: 2.0000 | INHALATION_SPRAY | Freq: Two times a day (BID) | RESPIRATORY_TRACT | 2 refills | Status: DC

## 2017-09-20 NOTE — Patient Instructions (Signed)
Continue the Symbicort twice a day Albuterol as needed in addition Add Singulair for allergies Let me know if this does not work  Follow up twice a year You need to call for new PCP

## 2017-09-20 NOTE — Progress Notes (Signed)
Chief Complaint  Patient presents with  . Shortness of Breath    has been using the proventil inhaler more than prescribed due to SOB.   Linda Carroll is brought in by her sister for follow-up. She is compliant with her Symbicort twice a day. She has run out of her albuterol.  She is using it more often than intended. She states that this time of year she has more wheezing due to pollen and allergies. No cough or sputum.  No fever or chills.  No cold symptoms.  Just increased shortness of breath. She last use the albuterol this morning. She is gained weight since her last visit.  We discussed that this does not help with her shortness of breath.  Is not good for her overall health.  Recommend reducing the calories in her diet and try to get more exercise.  She is reluctant to go outside because of the allergies.   Patient Active Problem List   Diagnosis Date Noted  . Uterine fibroid 08/12/2016  . Screening for diabetes mellitus (DM) 07/09/2016  . Need for lipid screening 07/09/2016  . Obesity 07/09/2016  . Encounter to establish care with new doctor 07/09/2016  . Asthma, chronic 05/08/2016    Outpatient Encounter Medications as of 09/20/2017  Medication Sig  . albuterol (PROVENTIL HFA) 108 (90 Base) MCG/ACT inhaler INHALE 2 PUFFS INTO LUNGS EVERY 4 HOURS AS NEEDED FOR WHEEZING OR SHORTNESS OF BREATH  . budesonide-formoterol (SYMBICORT) 80-4.5 MCG/ACT inhaler Inhale 2 puffs into the lungs 2 (two) times daily.  . montelukast (SINGULAIR) 10 MG tablet Take 1 tablet (10 mg total) by mouth at bedtime.   No facility-administered encounter medications on file as of 09/20/2017.     Allergies  Allergen Reactions  . Peanuts [Peanut Oil] Shortness Of Breath    Review of Systems  Constitutional: Positive for unexpected weight change. Negative for activity change and appetite change.  HENT: Negative for congestion, dental problem, postnasal drip and rhinorrhea.   Eyes: Negative for redness and  visual disturbance.  Respiratory: Positive for shortness of breath and wheezing. Negative for cough.   Cardiovascular: Negative for chest pain, palpitations and leg swelling.  Gastrointestinal: Negative for abdominal pain, constipation and diarrhea.  Genitourinary: Negative for difficulty urinating and frequency.  Musculoskeletal: Negative for arthralgias and back pain.  Neurological: Negative for dizziness and headaches.  Psychiatric/Behavioral: Negative for dysphoric mood and sleep disturbance. The patient is not nervous/anxious.     Physical Exam  Constitutional: She appears well-developed and well-nourished.  Slow responses.  Morbid obesity.  Pleasant  HENT:  Head: Normocephalic and atraumatic.  Mouth/Throat: Oropharynx is clear and moist.  Eyes: Pupils are equal, round, and reactive to light. EOM are normal.  Neck: Normal range of motion. No thyromegaly present.  Cardiovascular: Normal rate and normal heart sounds.  No murmur heard. Pulmonary/Chest: Effort normal and breath sounds normal. No respiratory distress. She has no wheezes.  Abdominal: Soft. Bowel sounds are normal. There is no tenderness.  Lymphadenopathy:    She has no cervical adenopathy.  Skin: Skin is warm.  Psychiatric: She has a normal mood and affect.    BP 132/82   Pulse 65   Resp 16   Ht 5\' 3"  (1.6 m)   Wt 204 lb (92.5 kg)   SpO2 96%   BMI 36.14 kg/m    ASSESSMENT/PLAN:  1. Moderate persistent asthma with exacerbation Discussed using Symbicort twice a day.  Add Singulair daily.  Will increase Symbicort if this  does not help.  2. Asthma due to seasonal allergies Add Singulair  3. Class 2 obesity due to excess calories without serious comorbidity with body mass index (BMI) of 35.0 to 35.9 in adult Recommend reducing calories, portions, fats and fried foods.  No snacking.  No sweet drinks.  Walk daily.   Patient Instructions  Continue the Symbicort twice a day Albuterol as needed in  addition Add Singulair for allergies Let me know if this does not work  Follow up twice a year You need to call for new PCP   Raylene Everts, MD

## 2017-09-30 ENCOUNTER — Telehealth: Payer: Self-pay | Admitting: Family Medicine

## 2017-09-30 ENCOUNTER — Other Ambulatory Visit: Payer: Self-pay

## 2017-09-30 MED ORDER — ALBUTEROL SULFATE HFA 108 (90 BASE) MCG/ACT IN AERS: INHALATION_SPRAY | RESPIRATORY_TRACT | 0 refills | Status: DC

## 2017-09-30 NOTE — Telephone Encounter (Signed)
Needs Meds Refilled, has an appt with Dr Anastasio Champion, on 6-26 to establish care.

## 2017-09-30 NOTE — Telephone Encounter (Signed)
Spoke with patient. She stated her rescue inhaler wouldn't last until her new patient appt in June. 1 refill sent in to preferred pharmacy

## 2017-09-30 NOTE — Telephone Encounter (Signed)
Called patient, no answer no vm

## 2017-10-11 NOTE — Telephone Encounter (Signed)
Pt is calling again regarding Medicine.

## 2017-10-12 NOTE — Telephone Encounter (Signed)
One month.  She may have another

## 2017-11-17 DIAGNOSIS — E6609 Other obesity due to excess calories: Secondary | ICD-10-CM | POA: Diagnosis not present

## 2017-11-17 DIAGNOSIS — J4530 Mild persistent asthma, uncomplicated: Secondary | ICD-10-CM | POA: Diagnosis not present

## 2017-11-17 DIAGNOSIS — I10 Essential (primary) hypertension: Secondary | ICD-10-CM | POA: Diagnosis not present

## 2017-12-27 DIAGNOSIS — J4540 Moderate persistent asthma, uncomplicated: Secondary | ICD-10-CM | POA: Diagnosis not present

## 2017-12-27 DIAGNOSIS — Z23 Encounter for immunization: Secondary | ICD-10-CM | POA: Diagnosis not present

## 2018-01-17 DIAGNOSIS — J4520 Mild intermittent asthma, uncomplicated: Secondary | ICD-10-CM | POA: Diagnosis not present

## 2018-04-20 DIAGNOSIS — Z1239 Encounter for other screening for malignant neoplasm of breast: Secondary | ICD-10-CM | POA: Diagnosis not present

## 2018-04-20 DIAGNOSIS — J4540 Moderate persistent asthma, uncomplicated: Secondary | ICD-10-CM | POA: Diagnosis not present

## 2018-04-20 DIAGNOSIS — N889 Noninflammatory disorder of cervix uteri, unspecified: Secondary | ICD-10-CM | POA: Diagnosis not present

## 2018-04-20 DIAGNOSIS — Z0000 Encounter for general adult medical examination without abnormal findings: Secondary | ICD-10-CM | POA: Diagnosis not present

## 2018-04-20 DIAGNOSIS — Z124 Encounter for screening for malignant neoplasm of cervix: Secondary | ICD-10-CM | POA: Diagnosis not present

## 2018-04-20 DIAGNOSIS — Z1151 Encounter for screening for human papillomavirus (HPV): Secondary | ICD-10-CM | POA: Diagnosis not present

## 2018-04-20 DIAGNOSIS — Z113 Encounter for screening for infections with a predominantly sexual mode of transmission: Secondary | ICD-10-CM | POA: Diagnosis not present

## 2018-04-20 DIAGNOSIS — I10 Essential (primary) hypertension: Secondary | ICD-10-CM | POA: Diagnosis not present

## 2018-04-20 DIAGNOSIS — Z130 Encounter for screening for diseases of the blood and blood-forming organs and certain disorders involving the immune mechanism: Secondary | ICD-10-CM | POA: Diagnosis not present

## 2018-05-16 DIAGNOSIS — Z1231 Encounter for screening mammogram for malignant neoplasm of breast: Secondary | ICD-10-CM | POA: Diagnosis not present

## 2018-07-23 DIAGNOSIS — R05 Cough: Secondary | ICD-10-CM | POA: Diagnosis not present

## 2018-07-23 DIAGNOSIS — J189 Pneumonia, unspecified organism: Secondary | ICD-10-CM | POA: Diagnosis not present

## 2019-04-26 DIAGNOSIS — Z0001 Encounter for general adult medical examination with abnormal findings: Secondary | ICD-10-CM | POA: Diagnosis not present

## 2019-04-26 DIAGNOSIS — I10 Essential (primary) hypertension: Secondary | ICD-10-CM | POA: Diagnosis not present

## 2019-04-26 DIAGNOSIS — J4540 Moderate persistent asthma, uncomplicated: Secondary | ICD-10-CM | POA: Diagnosis not present

## 2019-04-26 DIAGNOSIS — Z23 Encounter for immunization: Secondary | ICD-10-CM | POA: Diagnosis not present

## 2019-04-26 DIAGNOSIS — Z0000 Encounter for general adult medical examination without abnormal findings: Secondary | ICD-10-CM | POA: Diagnosis not present

## 2019-04-26 DIAGNOSIS — N920 Excessive and frequent menstruation with regular cycle: Secondary | ICD-10-CM | POA: Diagnosis not present

## 2019-06-21 DIAGNOSIS — R7989 Other specified abnormal findings of blood chemistry: Secondary | ICD-10-CM | POA: Diagnosis not present

## 2019-06-21 DIAGNOSIS — N920 Excessive and frequent menstruation with regular cycle: Secondary | ICD-10-CM | POA: Diagnosis not present

## 2019-07-03 DIAGNOSIS — N920 Excessive and frequent menstruation with regular cycle: Secondary | ICD-10-CM | POA: Diagnosis not present

## 2019-07-03 DIAGNOSIS — D500 Iron deficiency anemia secondary to blood loss (chronic): Secondary | ICD-10-CM | POA: Diagnosis not present

## 2019-07-07 DIAGNOSIS — D500 Iron deficiency anemia secondary to blood loss (chronic): Secondary | ICD-10-CM | POA: Diagnosis not present

## 2019-07-10 DIAGNOSIS — D252 Subserosal leiomyoma of uterus: Secondary | ICD-10-CM | POA: Diagnosis not present

## 2019-07-10 DIAGNOSIS — N920 Excessive and frequent menstruation with regular cycle: Secondary | ICD-10-CM | POA: Diagnosis not present

## 2019-07-10 DIAGNOSIS — D250 Submucous leiomyoma of uterus: Secondary | ICD-10-CM | POA: Diagnosis not present

## 2019-07-14 DIAGNOSIS — D500 Iron deficiency anemia secondary to blood loss (chronic): Secondary | ICD-10-CM | POA: Diagnosis not present

## 2019-08-02 DIAGNOSIS — D250 Submucous leiomyoma of uterus: Secondary | ICD-10-CM | POA: Diagnosis not present

## 2019-08-02 DIAGNOSIS — D500 Iron deficiency anemia secondary to blood loss (chronic): Secondary | ICD-10-CM | POA: Diagnosis not present

## 2019-08-02 DIAGNOSIS — N920 Excessive and frequent menstruation with regular cycle: Secondary | ICD-10-CM | POA: Diagnosis not present

## 2019-08-15 ENCOUNTER — Ambulatory Visit: Payer: Medicaid Other | Attending: Internal Medicine

## 2019-08-15 DIAGNOSIS — Z23 Encounter for immunization: Secondary | ICD-10-CM

## 2019-08-15 NOTE — Progress Notes (Signed)
   Covid-19 Vaccination Clinic  Name:  Linda Carroll    MRN: [id number] DOB: [birth year]  08/15/2019  [name] was observed post Covid-19 immunization for 15 minutes without incident. She was provided with Vaccine Information Sheet and instruction to access the V-Safe system.   [name] was instructed to call 911 with any severe reactions post vaccine: Marland Kitchen Difficulty breathing  . Swelling of face and throat  . A fast heartbeat  . A bad rash all over body  . Dizziness and weakness   Immunizations Administered    Name Date Dose VIS Date Route   Moderna COVID-19 Vaccine 08/15/2019 12:40 PM 0.5 mL 05/16/2019 Intramuscular   Manufacturer: Moderna   Lot: [id number]   [id number]

## 2019-09-12 ENCOUNTER — Ambulatory Visit: Payer: Medicaid Other | Attending: Internal Medicine

## 2019-09-12 DIAGNOSIS — Z23 Encounter for immunization: Secondary | ICD-10-CM

## 2019-09-12 NOTE — Progress Notes (Signed)
   Covid-19 Vaccination Clinic  Name:  Linda Carroll    MRN: [id number] DOB: May 03, 1976  09/12/2019  [name] was observed post Covid-19 immunization for 15 minutes without incident. She was provided with Vaccine Information Sheet and instruction to access the V-Safe system.   [name] was instructed to call 911 with any severe reactions post vaccine: Marland Kitchen Difficulty breathing  . Swelling of face and throat  . A fast heartbeat  . A bad rash all over body  . Dizziness and weakness   Immunizations Administered    Name Date Dose VIS Date Route   Moderna COVID-19 Vaccine 09/12/2019 10:18 AM 0.5 mL 05/16/2019 Intramuscular   Manufacturer: Moderna   Lot: [id number]   [id number]

## 2019-09-14 IMAGING — DX DG CHEST 2V
2 series · 2 of 2 positions shown · non-contrast
Comparison: May 08, 2016

CLINICAL DATA: Cough.

EXAM:
CHEST  2 VIEW

[chest pa]
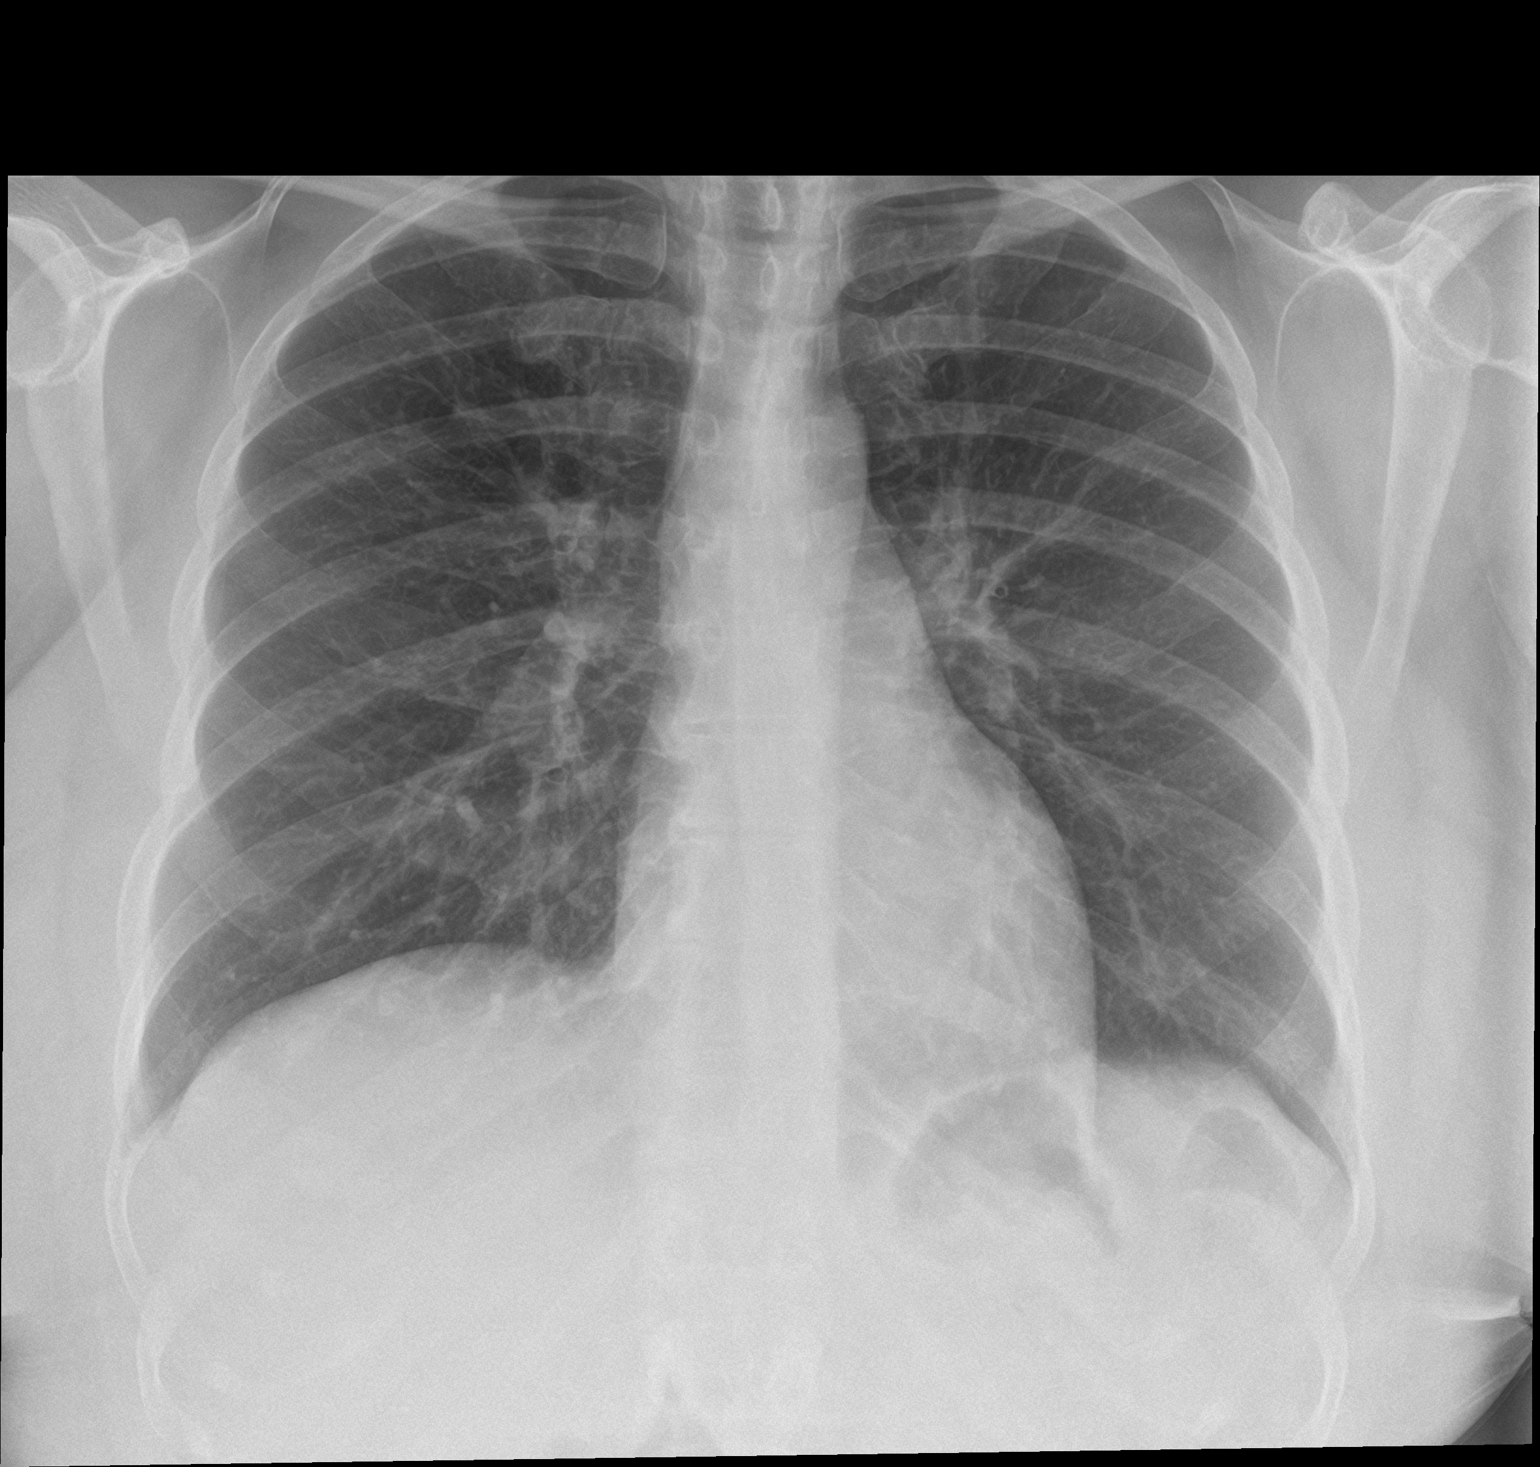

[chest lat]
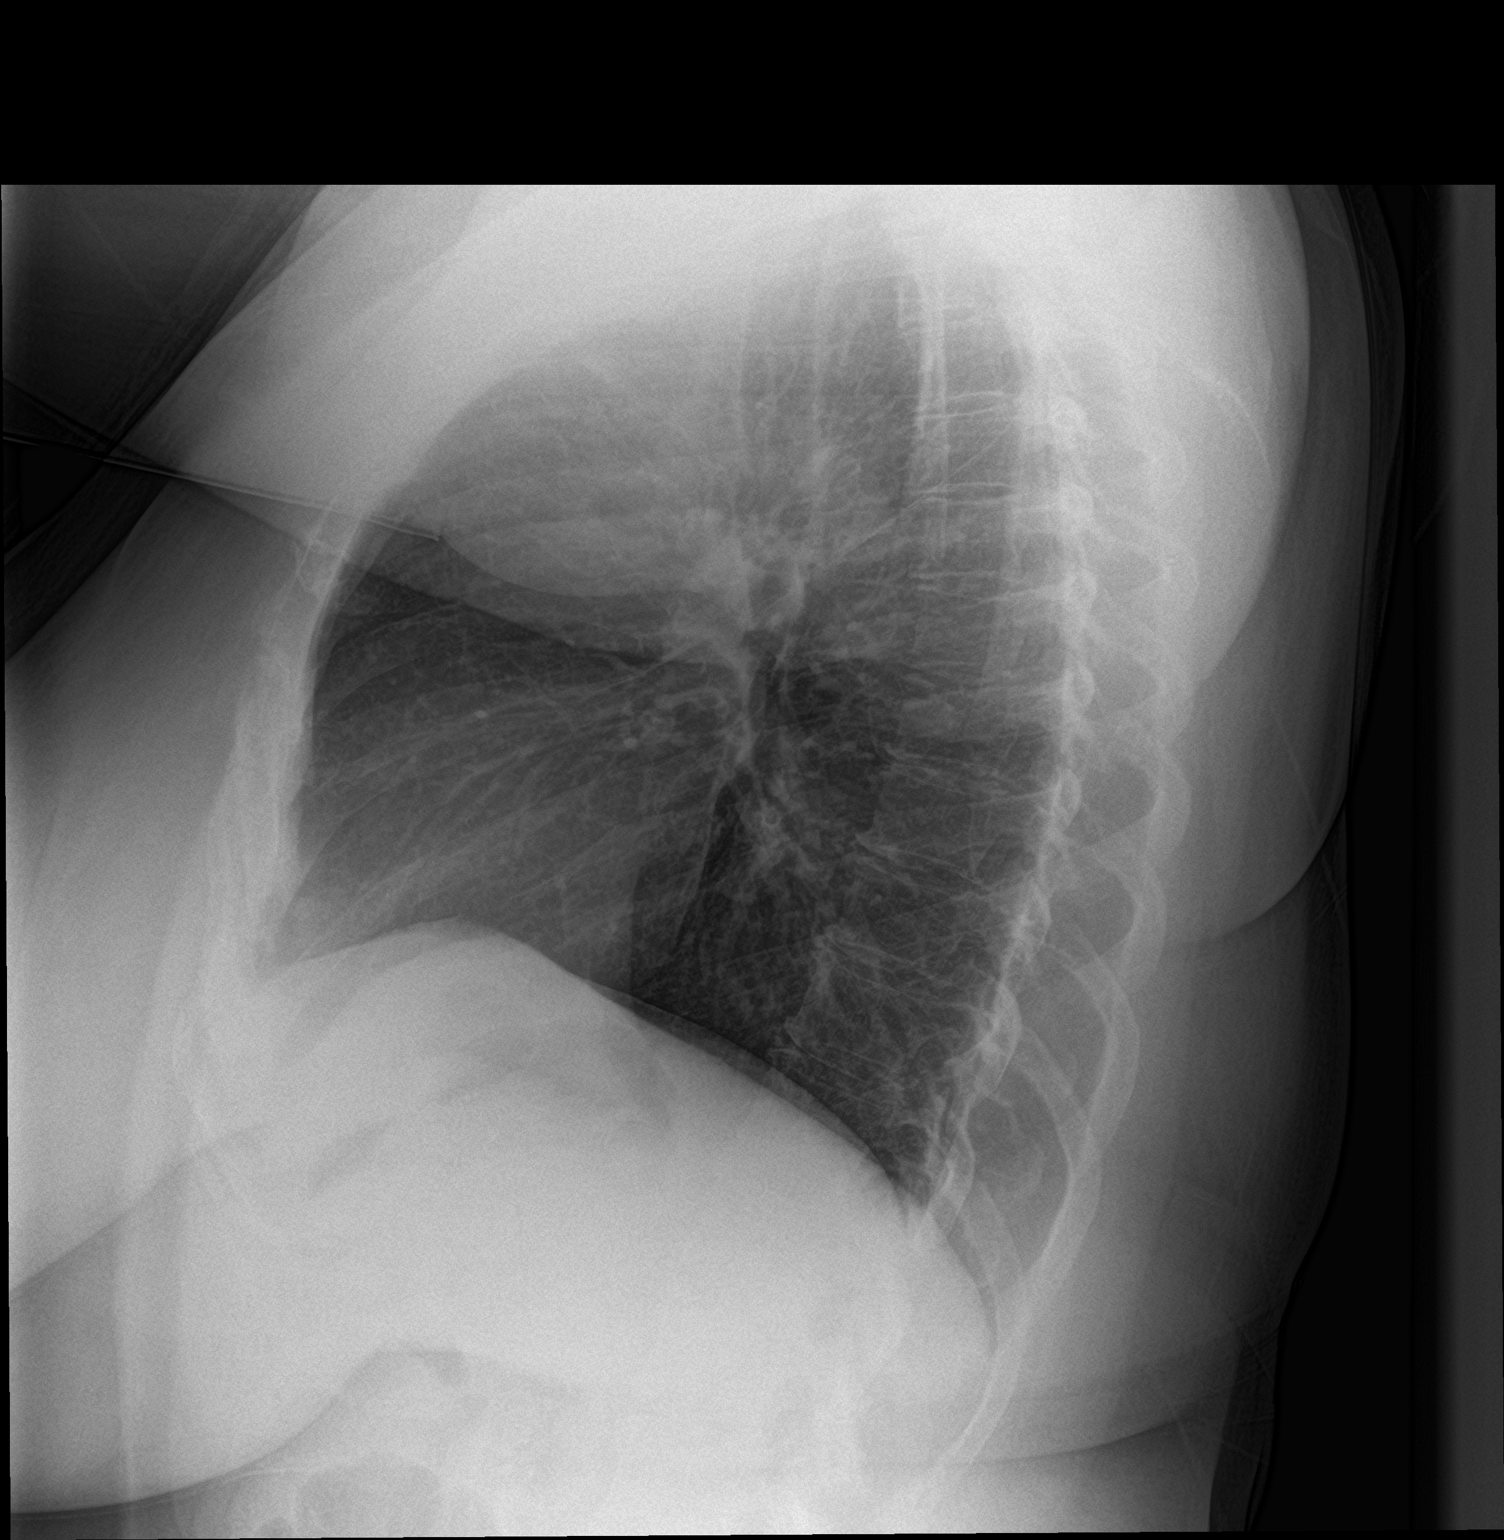

[2 of 2 positions shown; findings below may reference images not displayed]

FINDINGS: The heart size and mediastinal contours are within normal limits.
Both lungs are clear. The visualized skeletal structures are
unremarkable.
IMPRESSION: No active cardiopulmonary disease.

## 2019-10-04 DIAGNOSIS — D250 Submucous leiomyoma of uterus: Secondary | ICD-10-CM | POA: Diagnosis not present

## 2019-10-12 ENCOUNTER — Other Ambulatory Visit: Payer: Self-pay

## 2019-10-12 ENCOUNTER — Ambulatory Visit
Admission: EM | Admit: 2019-10-12 | Discharge: 2019-10-12 | Disposition: A | Discharge status: Home / Self Care | Payer: Medicaid Other | Attending: Emergency Medicine | Admitting: Emergency Medicine

## 2019-10-12 ENCOUNTER — Encounter: Payer: Self-pay | Admitting: Emergency Medicine

## 2019-10-12 DIAGNOSIS — J4521 Mild intermittent asthma with (acute) exacerbation: Secondary | ICD-10-CM

## 2019-10-12 MED ORDER — ALBUTEROL SULFATE HFA 108 (90 BASE) MCG/ACT IN AERS: 1.0000 | INHALATION_SPRAY | Freq: Four times a day (QID) | RESPIRATORY_TRACT | 0 refills | Status: DC | PRN

## 2019-10-12 MED ORDER — AZITHROMYCIN 250 MG PO TABS: 250.0000 mg | ORAL_TABLET | Freq: Every day | ORAL | 0 refills | Status: DC

## 2019-10-12 MED ORDER — PREDNISONE 10 MG (21) PO TBPK: ORAL_TABLET | Freq: Every day | ORAL | 0 refills | Status: DC

## 2019-10-12 NOTE — Discharge Instructions (Signed)
Proair inhaler prescribed.  Use as directed Take prednisone as prescribed and to completion Take azithromycin as prescribed Follow up with PCP next week Return here or go to ER if you have any new or worsening symptoms such as shortness of breath, difficulty breathing, accessory muscle use, rib retraction, or if symptoms do not improve with medication

## 2019-10-12 NOTE — ED Triage Notes (Addendum)
Onset today of symptoms around 3:00 pm.  Complains of chest congestion and sob.  Patient reports yellow phlegm.  Denies fever.  Denies aching.   Patient ran out of all medicines yesterday-today has had no medicine

## 2019-10-12 NOTE — ED Provider Notes (Signed)
[facility]    CSN: [id number] Arrival date & time: 10/12/19  1758      History   Chief Complaint Chief Complaint  Patient presents with  . Cough    HPI Linda Carroll is a 44 y.o. female.   With history of asthma presented to the urgent care with a complaint of congestion, shortness of breath and cough with yellow mucus for the past 1 day.  Denies sick exposure to COVID, flu or strep.  Denies recent travel.  Denies aggravating or alleviating symptoms.  Denies previous COVID infection.   Denies fever, chills, fatigue, nasal congestion, rhinorrhea, sore throat, wheezing, chest pain, nausea, vomiting, changes in bowel or bladder habits.    The history is provided by the patient. No language interpreter was used.    Past Medical History:  Diagnosis Date  . Allergy   . Asthma   . Bronchitis     Patient Active Problem List   Diagnosis Date Noted  . Uterine fibroid 08/12/2016  . Screening for diabetes mellitus (DM) 07/09/2016  . Need for lipid screening 07/09/2016  . Obesity 07/09/2016  . Encounter to establish care with new doctor 07/09/2016  . Asthma, chronic 05/08/2016    History reviewed. No pertinent surgical history.  OB History   No obstetric history on file.      Home Medications    Prior to Admission medications   Medication Sig Start Date End Date Taking? Authorizing Provider  budesonide-formoterol (SYMBICORT) 80-4.5 MCG/ACT inhaler Inhale 2 puffs into the lungs 2 (two) times daily. 09/20/17  Yes Raylene Everts, MD  montelukast (SINGULAIR) 10 MG tablet Take 1 tablet (10 mg total) by mouth at bedtime. 09/20/17  Yes Raylene Everts, MD  SYMBICORT 80-4.5 MCG/ACT inhaler INHALE 2 PUFFS BY MOUTH TWICE DAILY 10/04/17  Yes Raylene Everts, MD  albuterol (VENTOLIN HFA) 108 (90 Base) MCG/ACT inhaler Inhale 1-2 puffs into the lungs every 6 (six) hours as needed for wheezing or shortness of breath. 10/12/19   Riki Berninger, Darrelyn Hillock, FNP  azithromycin  (ZITHROMAX) 250 MG tablet Take 1 tablet (250 mg total) by mouth daily. Take first 2 tablets together, then 1 every day until finished. 10/12/19   Danyiel Crespin, Darrelyn Hillock, FNP  predniSONE (STERAPRED UNI-PAK 21 TAB) 10 MG (21) TBPK tablet Take by mouth daily. Take 6 tabs by mouth daily  for 2 days, then 5 tabs for 2 days, then 4 tabs for 2 days, then 3 tabs for 2 days, 2 tabs for 2 days, then 1 tab by mouth daily for 2 days 10/12/19   Emerson Monte, FNP    Family History Family History  Problem Relation Age of Onset  . Heart disease Mother 78  . Lupus Sister   . Kidney disease Sister        dialysis - lupus    Social History Social History   Tobacco Use  . Smoking status: Never Smoker  . Smokeless tobacco: Never Used  Substance Use Topics  . Alcohol use: No  . Drug use: No     Allergies   Peanuts [peanut oil]   Review of Systems Review of Systems  Constitutional: Negative.   HENT: Positive for congestion.   Respiratory: Positive for cough and shortness of breath.   Cardiovascular: Negative.   Gastrointestinal: Negative.   Neurological: Negative.   All other systems reviewed and are negative.    Physical Exam Triage Vital Signs ED Triage Vitals [10/12/19 1816]  Enc Vitals Group  BP (!) 152/72     Pulse Rate 75     Resp 16     Temp 98.1 F (36.7 C)     Temp Source Oral     SpO2 98 %     Weight      Height      Head Circumference      Peak Flow      Pain Score      Pain Loc      Pain Edu?      Excl. in [place]?    No data found.  Updated Vital Signs BP (!) 152/72 (BP Location: Right Wrist)   Pulse 75   Temp 98.1 F (36.7 C) (Oral)   Resp 16   SpO2 98%   Visual Acuity Right Eye Distance:   Left Eye Distance:   Bilateral Distance:    Right Eye Near:   Left Eye Near:    Bilateral Near:     Physical Exam Vitals and nursing note reviewed.  Constitutional:      General: She is not in acute distress.    Appearance: Normal appearance. She is normal  weight. She is not ill-appearing or toxic-appearing.  HENT:     Head: Normocephalic.     Right Ear: Tympanic membrane, ear canal and external ear normal. There is no impacted cerumen.     Left Ear: Tympanic membrane, ear canal and external ear normal. There is no impacted cerumen.     Nose: Nose normal. No congestion.     Mouth/Throat:     Mouth: Mucous membranes are moist.     Pharynx: Oropharynx is clear. No oropharyngeal exudate or posterior oropharyngeal erythema.  Cardiovascular:     Rate and Rhythm: Normal rate and regular rhythm.     Pulses: Normal pulses.     Heart sounds: Normal heart sounds. No murmur.  Pulmonary:     Effort: Pulmonary effort is normal. No respiratory distress.     Breath sounds: Normal breath sounds. No wheezing or rhonchi.  Chest:     Chest wall: No tenderness.  Neurological:     Mental Status: She is alert and oriented to person, place, and time.      UC Treatments / Results  Labs (all labs ordered are listed, but only abnormal results are displayed) Labs Reviewed - No data to display  EKG   Radiology No results found.  Procedures Procedures (including critical care time)  Medications Ordered in UC Medications - No data to display  Initial Impression / Assessment and Plan / UC Course  I have reviewed the triage vital signs and the nursing notes.  Pertinent labs & imaging results that were available during my care of the patient were reviewed by me and considered in my medical decision making (see chart for details).     Patient is stable at discharge.  ProAir was prescribed for shortness of breath.  Prednisone was prescribed.  Azithromycin was prescribed for possible infection.  Follow-up with PCP. \ Final Clinical Impressions(s) / UC Diagnoses   Final diagnoses:  Mild intermittent asthma with acute exacerbation     Discharge Instructions     Proair inhaler prescribed.  Use as directed Take prednisone as prescribed and to  completion Take azithromycin as prescribed Follow up with PCP next week Return here or go to ER if you have any new or worsening symptoms such as shortness of breath, difficulty breathing, accessory muscle use, rib retraction, or if symptoms do not improve with  medication     ED Prescriptions    Medication Sig Dispense Auth. Provider   predniSONE (STERAPRED UNI-PAK 21 TAB) 10 MG (21) TBPK tablet Take by mouth daily. Take 6 tabs by mouth daily  for 2 days, then 5 tabs for 2 days, then 4 tabs for 2 days, then 3 tabs for 2 days, 2 tabs for 2 days, then 1 tab by mouth daily for 2 days 42 tablet Sheena Donegan S, FNP   albuterol (VENTOLIN HFA) 108 (90 Base) MCG/ACT inhaler Inhale 1-2 puffs into the lungs every 6 (six) hours as needed for wheezing or shortness of breath. 18 g Deira Shimer, Darrelyn Hillock, FNP   azithromycin (ZITHROMAX) 250 MG tablet Take 1 tablet (250 mg total) by mouth daily. Take first 2 tablets together, then 1 every day until finished. 6 tablet Raykwon Hobbs, Darrelyn Hillock, FNP     PDMP not reviewed this encounter.   Emerson Monte, FNP 10/12/19 1933

## 2019-10-16 DIAGNOSIS — E611 Iron deficiency: Secondary | ICD-10-CM | POA: Diagnosis not present

## 2019-10-16 DIAGNOSIS — J4541 Moderate persistent asthma with (acute) exacerbation: Secondary | ICD-10-CM | POA: Diagnosis not present

## 2019-10-31 ENCOUNTER — Ambulatory Visit
Admission: EM | Admit: 2019-10-31 | Discharge: 2019-10-31 | Disposition: A | Discharge status: Home / Self Care | Payer: Medicaid Other | Attending: Emergency Medicine | Admitting: Emergency Medicine

## 2019-10-31 DIAGNOSIS — J4521 Mild intermittent asthma with (acute) exacerbation: Secondary | ICD-10-CM | POA: Diagnosis not present

## 2019-10-31 DIAGNOSIS — R0602 Shortness of breath: Secondary | ICD-10-CM

## 2019-10-31 MED ORDER — ALBUTEROL SULFATE HFA 108 (90 BASE) MCG/ACT IN AERS: 1.0000 | INHALATION_SPRAY | Freq: Four times a day (QID) | RESPIRATORY_TRACT | 0 refills | Status: DC | PRN

## 2019-10-31 MED ORDER — PREDNISONE 10 MG (21) PO TBPK: ORAL_TABLET | ORAL | 0 refills | Status: DC

## 2019-10-31 MED ORDER — BUDESONIDE-FORMOTEROL FUMARATE 80-4.5 MCG/ACT IN AERO: 2.0000 | INHALATION_SPRAY | Freq: Two times a day (BID) | RESPIRATORY_TRACT | 12 refills | Status: DC

## 2019-10-31 NOTE — ED Provider Notes (Signed)
[facility]   [id number] 10/31/19 Arrival Time: [place]   [id number] exacerbation  SUBJECTIVE: History from: family.  Linda Carroll is a 44 y.o. female who presents with complaint of intermittent dyspnea. Triggers: change in weather and cold symptoms. Onset gradual, approximately 4 days ago. Describes wheezing as none when present. Fever: no. Overall normal PO intake without n/v. Sick contacts: no Typically her asthma is not well controlled. Denies fever, chills, nausea, vomiting, SOB, chest pain, abdominal pain, changes in bowel or bladder function.   Inhaler use: Albuterol, and Symbicort.   Received flu shot this year: yes.   ROS: As per HPI.  All other pertinent ROS negative.    Past Medical History:  Diagnosis Date  . Allergy   . Asthma   . Bronchitis    History reviewed. No pertinent surgical history. Allergies  Allergen Reactions  . Peanuts [Peanut Oil] Shortness Of Breath   No current facility-administered medications on file prior to encounter.   Current Outpatient Medications on File Prior to Encounter  Medication Sig Dispense Refill  . azithromycin (ZITHROMAX) 250 MG tablet Take 1 tablet (250 mg total) by mouth daily. Take first 2 tablets together, then 1 every day until finished. 6 tablet 0  . montelukast (SINGULAIR) 10 MG tablet Take 1 tablet (10 mg total) by mouth at bedtime. 90 tablet 1   Social History   Socioeconomic History  . Marital status: Single    Spouse name: Not on file  . Number of children: 1  . Years of education: 61  . Highest education level: Not on file  Occupational History  . Occupation: disabled    Comment: asthma  Tobacco Use  . Smoking status: Never Smoker  . Smokeless tobacco: Never Used  Substance and Sexual Activity  . Alcohol use: No  . Drug use: No  . Sexual activity: Not Currently    Birth control/protection: None  Other Topics Concern  . Not on file  Social History Narrative   Lives with daughter   Disabled  from [place]   No car/never driven   Social Determinants of Health   Financial Resource Strain:   . Difficulty of Paying Living Expenses:   Food Insecurity:   . Worried About Charity fundraiser in the Last Year:   . Arboriculturist in the Last Year:   Transportation Needs:   . Film/video editor (Medical):   Marland Kitchen Lack of Transportation (Non-Medical):   Physical Activity:   . Days of Exercise per Week:   . Minutes of Exercise per Session:   Stress:   . Feeling of Stress :   Social Connections:   . Frequency of Communication with Friends and Family:   . Frequency of Social Gatherings with Friends and Family:   . Attends Religious Services:   . Active Member of Clubs or Organizations:   . Attends Archivist Meetings:   Marland Kitchen Marital Status:   Intimate Partner Violence:   . Fear of Current or Ex-Partner:   . Emotionally Abused:   Marland Kitchen Physically Abused:   . Sexually Abused:    Family History  Problem Relation Age of Onset  . Heart disease Mother 59  . Lupus Sister   . Kidney disease Sister        dialysis - lupus    OBJECTIVE:  Vitals:   10/31/19 1306  BP: 139/78  Pulse: 83  Resp: 17  Temp: 98.9 F (37.2 C)  TempSrc: Oral  SpO2: 98%  General appearance: alert; appears fatigued HEENT: nasal congestion; clear runny nose; throat irritation secondary to post-nasal drainage Neck: supple without LAD Lungs: unlabored respirations, No wheezing; cough: absent; no significant respiratory distress Skin: warm and dry Psychological: alert and cooperative; normal mood and affect  Imaging: No results found.  ASSESSMENT & PLAN:  1. Shortness of breath   2. Mild intermittent asthma with acute exacerbation      Meds ordered this encounter  Medications  . albuterol (VENTOLIN HFA) 108 (90 Base) MCG/ACT inhaler    Sig: Inhale 1-2 puffs into the lungs every 6 (six) hours as needed for wheezing or shortness of breath.    Dispense:  18 g    Refill:  0  .  budesonide-formoterol (SYMBICORT) 80-4.5 MCG/ACT inhaler    Sig: Inhale 2 puffs into the lungs 2 (two) times daily.    Dispense:  1 Inhaler    Refill:  12  . predniSONE (STERAPRED UNI-PAK 21 TAB) 10 MG (21) TBPK tablet    Sig: Take 6 tabs by mouth daily  for 2 days, then 5 tabs for 2 days, then 4 tabs for 2 days, then 3 tabs for 2 days, 2 tabs for 2 days, then 1 tab by mouth daily for 2 days    Dispense:  42 tablet    Refill:  0   Discharge instruction Proair inhaler prescribed.  Use as directed Symbicort was prescribed.  Use as directed Take steroid as prescribed and to completion Follow up with PCP next week Return here or go to ER if you have any new or worsening symptoms such as shortness of breath, difficulty breathing, accessory muscle use, rib retraction, or if symptoms do not improve with medication    Reviewed expectations re: course of current medical issues. Questions answered. Outlined signs and symptoms indicating need for more acute intervention. Patient verbalized understanding. After Visit Summary given.          Emerson Monte, [place] 10/31/19 1335

## 2019-10-31 NOTE — Discharge Instructions (Addendum)
Proair inhaler prescribed.  Use as directed Symbicort was prescribed.  Use as directed Take steroid as prescribed and to completion Follow up with PCP next week Return here or go to ER if you have any new or worsening symptoms such as shortness of breath, difficulty breathing, accessory muscle use, rib retraction, or if symptoms do not improve with medication

## 2019-10-31 NOTE — ED Triage Notes (Signed)
Pt presents with c/o asthma flare and sob that began earlier , has used all of inhaler

## 2019-12-20 DIAGNOSIS — N921 Excessive and frequent menstruation with irregular cycle: Secondary | ICD-10-CM | POA: Diagnosis not present

## 2020-01-01 ENCOUNTER — Encounter: Payer: Self-pay | Admitting: Emergency Medicine

## 2020-01-01 ENCOUNTER — Other Ambulatory Visit: Payer: Self-pay

## 2020-01-01 ENCOUNTER — Ambulatory Visit
Admission: EM | Admit: 2020-01-01 | Discharge: 2020-01-01 | Disposition: A | Discharge status: Home / Self Care | Payer: Medicaid Other | Attending: Emergency Medicine | Admitting: Emergency Medicine

## 2020-01-01 DIAGNOSIS — J4521 Mild intermittent asthma with (acute) exacerbation: Secondary | ICD-10-CM

## 2020-01-01 MED ORDER — ALBUTEROL SULFATE HFA 108 (90 BASE) MCG/ACT IN AERS
2.0000 | INHALATION_SPRAY | Freq: Once | RESPIRATORY_TRACT | Status: AC
  Administered 2020-01-01: 11:00:00 2 via RESPIRATORY_TRACT

## 2020-01-01 MED ORDER — DEXAMETHASONE SODIUM PHOSPHATE 10 MG/ML IJ SOLN
10.0000 mg | Freq: Once | INTRAMUSCULAR | Status: AC
  Administered 2020-01-01: 11:00:00 10 mg via INTRAMUSCULAR

## 2020-01-01 MED ORDER — PREDNISONE 10 MG (21) PO TBPK: ORAL_TABLET | ORAL | 0 refills | Status: DC

## 2020-01-01 NOTE — Discharge Instructions (Signed)
Steroid shot given in office Proair inhaler given in office Take steroid as prescribed and to completion Follow up with PCP next week Return here or go to ER if you have any new or worsening symptoms such as shortness of breath, difficulty breathing, accessory muscle use, rib retraction, or if symptoms do not improve with medication, etc..Linda Carroll

## 2020-01-01 NOTE — ED Triage Notes (Signed)
Last night asthma spells started, but pharmacy said she could not refill her medication until next month.

## 2020-01-01 NOTE — ED Provider Notes (Signed)
[place]   [id number] 01/01/20 Arrival Time: 1029   DX:IPJASN  SUBJECTIVE: History from: patient.  Linda Carroll is a 44 y.o. female who presents with complaint of intermittent wheezing and SOB. Triggers: change in weather. Onset abrupt, approximately 1 day ago. Describes wheezing as mild when present. Fever: no. Overall normal PO intake without n/v. Sick contacts: no.  Worse at night  Typically her asthma is well controlled. Denies fever, chills, nausea, vomiting, SOB, chest pain, abdominal pain, changes in bowel or bladder function.    Inhaler use: albuterol and symbicort.  Ran out of albuterol inhaler.  Attempted to get a refill and was told by pharmacy that medicaid would not cover it.    ROS: As per HPI.  All other pertinent ROS negative.    Past Medical History:  Diagnosis Date  . Allergy   . Asthma   . Bronchitis    History reviewed. No pertinent surgical history. Allergies  Allergen Reactions  . Peanuts [Peanut Oil] Shortness Of Breath   No current facility-administered medications on file prior to encounter.   Current Outpatient Medications on File Prior to Encounter  Medication Sig Dispense Refill  . albuterol (VENTOLIN HFA) 108 (90 Base) MCG/ACT inhaler Inhale 1-2 puffs into the lungs every 6 (six) hours as needed for wheezing or shortness of breath. 18 g 0  . budesonide-formoterol (SYMBICORT) 80-4.5 MCG/ACT inhaler Inhale 2 puffs into the lungs 2 (two) times daily. 1 Inhaler 12  . montelukast (SINGULAIR) 10 MG tablet Take 1 tablet (10 mg total) by mouth at bedtime. 90 tablet 1   Social History   Socioeconomic History  . Marital status: Single    Spouse name: Not on file  . Number of children: 1  . Years of education: 18  . Highest education level: Not on file  Occupational History  . Occupation: disabled    Comment: asthma  Tobacco Use  . Smoking status: Never Smoker  . Smokeless tobacco: Never Used  Vaping Use  . Vaping Use: Never used    Substance and Sexual Activity  . Alcohol use: No  . Drug use: No  . Sexual activity: Not Currently    Birth control/protection: None  Other Topics Concern  . Not on file  Social History Narrative   Lives with daughter   Disabled from [place]   No car/never driven   Social Determinants of Health   Financial Resource Strain:   . Difficulty of Paying Living Expenses:   Food Insecurity:   . Worried About Charity fundraiser in the Last Year:   . Arboriculturist in the Last Year:   Transportation Needs:   . Film/video editor (Medical):   Marland Kitchen Lack of Transportation (Non-Medical):   Physical Activity:   . Days of Exercise per Week:   . Minutes of Exercise per Session:   Stress:   . Feeling of Stress :   Social Connections:   . Frequency of Communication with Friends and Family:   . Frequency of Social Gatherings with Friends and Family:   . Attends Religious Services:   . Active Member of Clubs or Organizations:   . Attends Archivist Meetings:   Marland Kitchen Marital Status:   Intimate Partner Violence:   . Fear of Current or Ex-Partner:   . Emotionally Abused:   Marland Kitchen Physically Abused:   . Sexually Abused:    Family History  Problem Relation Age of Onset  . Heart disease Mother 40  .  Lupus Sister   . Kidney disease Sister        dialysis - lupus    OBJECTIVE:  Vitals:   01/01/20 1038  BP: 134/77  Pulse: 73  Resp: 18  Temp: 98.2 F (36.8 C)  TempSrc: Oral  SpO2: 99%  Weight: 204 lb (92.5 kg)     General appearance: alert; appears mildly fatigued HEENT: nasal congestion; clear runny nose; throat irritation secondary to post-nasal drainage Neck: supple without LAD Lungs: unlabored respirations, no wheezing; cough: absent; no significant respiratory distress Skin: warm and dry Psychological: alert and cooperative; normal mood and affect  ASSESSMENT & PLAN:  1. Mild intermittent asthma with exacerbation     Meds ordered this encounter  Medications  .  predniSONE (STERAPRED UNI-PAK 21 TAB) 10 MG (21) TBPK tablet    Sig: Take 6 tabs by mouth daily  for 2 days, then 5 tabs for 2 days, then 4 tabs for 2 days, then 3 tabs for 2 days, 2 tabs for 2 days, then 1 tab by mouth daily for 2 days    Dispense:  42 tablet    Refill:  0    Order Specific Question:   Supervising Provider    Answer:   Raylene Everts [[id number]]  . albuterol (VENTOLIN HFA) 108 (90 Base) MCG/ACT inhaler 2 puff  . dexamethasone (DECADRON) injection 10 mg   Steroid shot given in office Proair inhaler given in office Take steroid as prescribed and to completion Follow up with PCP next week Return here or go to ER if you have any new or worsening symptoms such as shortness of breath, difficulty breathing, accessory muscle use, rib retraction, or if symptoms do not improve with medication, etc...   Reviewed expectations re: course of current medical issues. Questions answered. Outlined signs and symptoms indicating need for more acute intervention. Patient verbalized understanding. After Visit Summary given.          Lestine Box, PA-C 01/01/20 1052

## 2020-01-19 DIAGNOSIS — D250 Submucous leiomyoma of uterus: Secondary | ICD-10-CM | POA: Diagnosis not present

## 2020-01-19 DIAGNOSIS — N852 Hypertrophy of uterus: Secondary | ICD-10-CM | POA: Diagnosis not present

## 2020-01-19 DIAGNOSIS — D259 Leiomyoma of uterus, unspecified: Secondary | ICD-10-CM | POA: Diagnosis not present

## 2020-01-19 DIAGNOSIS — N921 Excessive and frequent menstruation with irregular cycle: Secondary | ICD-10-CM | POA: Diagnosis not present

## 2020-06-10 ENCOUNTER — Ambulatory Visit
Admission: EM | Admit: 2020-06-10 | Discharge: 2020-06-10 | Disposition: A | Discharge status: Home / Self Care | Payer: BLUE CROSS/BLUE SHIELD | Attending: Internal Medicine | Admitting: Internal Medicine

## 2020-06-10 DIAGNOSIS — J4531 Mild persistent asthma with (acute) exacerbation: Secondary | ICD-10-CM | POA: Diagnosis not present

## 2020-06-10 MED ORDER — BUDESONIDE-FORMOTEROL FUMARATE 80-4.5 MCG/ACT IN AERO: 2.0000 | INHALATION_SPRAY | Freq: Two times a day (BID) | RESPIRATORY_TRACT | 0 refills | Status: AC

## 2020-06-10 MED ORDER — PREDNISONE 20 MG PO TABS: 40.0000 mg | ORAL_TABLET | Freq: Every day | ORAL | 0 refills | Status: AC

## 2020-06-10 MED ORDER — MONTELUKAST SODIUM 10 MG PO TABS: 10.0000 mg | ORAL_TABLET | Freq: Every day | ORAL | 1 refills | Status: AC

## 2020-06-10 MED ORDER — ALBUTEROL SULFATE HFA 108 (90 BASE) MCG/ACT IN AERS: 1.0000 | INHALATION_SPRAY | Freq: Four times a day (QID) | RESPIRATORY_TRACT | 0 refills | Status: AC | PRN

## 2020-06-10 NOTE — ED Provider Notes (Signed)
[facility]    CSN: [id number] Arrival date & time: 06/10/20  1336      History   Chief Complaint No chief complaint on file.   HPI Linda Carroll is a 44 y.o. female with a history of asthma comes to the urgent care with worsening shortness of breath, wheezing which started this morning.  Shortness of breath is worse when she lays flat.  She denies any lower extremity swelling.  Shortness of breath appears to improve when she sits up.  She is run out of her inhalers.  No fever or chills.  No cough or sputum production.  Patient is fully vaccinated COVID-19 virus.  No headaches or dizziness.   HPI  Past Medical History:  Diagnosis Date  . Allergy   . Asthma   . Bronchitis     Patient Active Problem List   Diagnosis Date Noted  . Uterine fibroid 08/12/2016  . Screening for diabetes mellitus (DM) 07/09/2016  . Need for lipid screening 07/09/2016  . Obesity 07/09/2016  . Encounter to establish care with new doctor 07/09/2016  . Asthma, chronic 05/08/2016    History reviewed. No pertinent surgical history.  OB History   No obstetric history on file.      Home Medications    Prior to Admission medications   Medication Sig Start Date End Date Taking? Authorizing Provider  predniSONE (DELTASONE) 20 MG tablet Take 2 tablets (40 mg total) by mouth daily for 5 days. 06/10/20 06/15/20 Yes Mackey Varricchio, Britta Mccreedy, MD  albuterol (VENTOLIN HFA) 108 (90 Base) MCG/ACT inhaler Inhale 1-2 puffs into the lungs every 6 (six) hours as needed for wheezing or shortness of breath. 06/10/20   Merrilee Jansky, MD  budesonide-formoterol (SYMBICORT) 80-4.5 MCG/ACT inhaler Inhale 2 puffs into the lungs 2 (two) times daily. 06/10/20 07/10/20  Merrilee Jansky, MD  montelukast (SINGULAIR) 10 MG tablet Take 1 tablet (10 mg total) by mouth at bedtime. 06/10/20   LampteyBritta Mccreedy, MD    Family History Family History  Problem Relation Age of Onset  . Heart disease Mother 87  . Lupus  Sister   . Kidney disease Sister        dialysis - lupus    Social History Social History   Tobacco Use  . Smoking status: Never Smoker  . Smokeless tobacco: Never Used  Vaping Use  . Vaping Use: Never used  Substance Use Topics  . Alcohol use: No  . Drug use: No     Allergies   Peanuts [peanut oil]   Review of Systems Review of Systems  Constitutional: Negative.   Eyes: Negative.   Respiratory: Positive for cough, chest tightness, shortness of breath and wheezing.   Gastrointestinal: Negative.      Physical Exam Triage Vital Signs ED Triage Vitals  Enc Vitals Group     BP 06/10/20 1556 137/87     Pulse Rate 06/10/20 1556 79     Resp 06/10/20 1556 (!) 22     Temp 06/10/20 1556 98.2 F (36.8 C)     Temp src --      SpO2 06/10/20 1556 98 %     Weight --      Height --      Head Circumference --      Peak Flow --      Pain Score 06/10/20 1655 0     Pain Loc --      Pain Edu? --  Excl. in GC? --    No data found.  Updated Vital Signs BP 137/87   Pulse 79   Temp 98.2 F (36.8 C)   Resp (!) 22   LMP  (LMP Unknown)   SpO2 98%   Visual Acuity Right Eye Distance:   Left Eye Distance:   Bilateral Distance:    Right Eye Near:   Left Eye Near:    Bilateral Near:     Physical Exam Vitals and nursing note reviewed.  Constitutional:      General: She is not in acute distress.    Appearance: She is not ill-appearing.  HENT:     Right Ear: Tympanic membrane normal.     Left Ear: Tympanic membrane normal.  Cardiovascular:     Rate and Rhythm: Normal rate and regular rhythm.     Pulses: Normal pulses.     Heart sounds: Normal heart sounds.  Pulmonary:     Effort: Pulmonary effort is normal.     Breath sounds: Normal breath sounds.  Skin:    Capillary Refill: Capillary refill takes less than 2 seconds.  Neurological:     Mental Status: She is alert.      UC Treatments / Results  Labs (all labs ordered are listed, but only abnormal  results are displayed) Labs Reviewed - No data to display  EKG   Radiology No results found.  Procedures Procedures (including critical care time)  Medications Ordered in UC Medications - No data to display  Initial Impression / Assessment and Plan / UC Course  I have reviewed the triage vital signs and the nursing notes.  Pertinent labs & imaging results that were available during my care of the patient were reviewed by me and considered in my medical decision making (see chart for details).     1.  Mild persistent asthma with acute exacerbation: Albuterol inhaler as needed for shortness of breath/wheezing Symbicort Singulair Short course of steroids If symptoms worsen please return to urgent care to be reevaluated. Final Clinical Impressions(s) / UC Diagnoses   Final diagnoses:  Mild persistent asthma with acute exacerbation     Discharge Instructions     Please take your medications as directed If your symptoms worsen please return to the urgent care to be reevaluated.   ED Prescriptions    Medication Sig Dispense Auth. Provider   albuterol (VENTOLIN HFA) 108 (90 Base) MCG/ACT inhaler Inhale 1-2 puffs into the lungs every 6 (six) hours as needed for wheezing or shortness of breath. 18 g Merrilee Jansky, MD   budesonide-formoterol [facility]) 80-4.5 MCG/ACT inhaler Inhale 2 puffs into the lungs 2 (two) times daily. 10.2 g Nisreen Guise, Britta Mccreedy, MD   montelukast (SINGULAIR) 10 MG tablet Take 1 tablet (10 mg total) by mouth at bedtime. 90 tablet Mekiyah Gladwell, Britta Mccreedy, MD   predniSONE (DELTASONE) 20 MG tablet Take 2 tablets (40 mg total) by mouth daily for 5 days. 10 tablet Inger Wiest, Britta Mccreedy, MD     PDMP not reviewed this encounter.   Merrilee Jansky, MD 06/10/20 [id number]

## 2020-06-10 NOTE — ED Triage Notes (Signed)
Pt presents with asthma flare that began today, pt is out of medications and needs refill

## 2020-06-10 NOTE — Discharge Instructions (Signed)
Please take your medications as directed If your symptoms worsen please return to the urgent care to be reevaluated.

## 2023-07-19 ENCOUNTER — Emergency Department (HOSPITAL_COMMUNITY)
Admission: EM | Admit: 2023-07-19 | Discharge: 2023-07-19 | Disposition: A | Discharge status: Home / Self Care | Payer: Medicaid Other | Attending: Emergency Medicine | Admitting: Emergency Medicine

## 2023-07-19 ENCOUNTER — Other Ambulatory Visit: Payer: Self-pay

## 2023-07-19 ENCOUNTER — Encounter (HOSPITAL_COMMUNITY): Payer: Self-pay | Admitting: Emergency Medicine

## 2023-07-19 DIAGNOSIS — D509 Iron deficiency anemia, unspecified: Secondary | ICD-10-CM | POA: Insufficient documentation

## 2023-07-19 DIAGNOSIS — R739 Hyperglycemia, unspecified: Secondary | ICD-10-CM | POA: Insufficient documentation

## 2023-07-19 DIAGNOSIS — Z91010 Allergy to peanuts: Secondary | ICD-10-CM | POA: Insufficient documentation

## 2023-07-19 DIAGNOSIS — R197 Diarrhea, unspecified: Secondary | ICD-10-CM | POA: Diagnosis not present

## 2023-07-19 DIAGNOSIS — R112 Nausea with vomiting, unspecified: Secondary | ICD-10-CM | POA: Diagnosis present

## 2023-07-19 DIAGNOSIS — D72829 Elevated white blood cell count, unspecified: Secondary | ICD-10-CM | POA: Diagnosis not present

## 2023-07-19 LAB — COMPREHENSIVE METABOLIC PANEL
ALT: 17 U/L (ref 0–44)
AST: 19 U/L (ref 15–41)
Albumin: 3.4 g/dL — ABNORMAL LOW (ref 3.5–5.0)
Alkaline Phosphatase: 52 U/L (ref 38–126)
Anion gap: 8 (ref 5–15)
BUN: 10 mg/dL (ref 6–20)
CO2: 23 mmol/L (ref 22–32)
Calcium: 8.4 mg/dL — ABNORMAL LOW (ref 8.9–10.3)
Chloride: 108 mmol/L (ref 98–111)
Creatinine, Ser: 0.78 mg/dL (ref 0.44–1.00)
GFR, Estimated: >60 mL/min (ref >60)
Glucose, Bld: 129 mg/dL — ABNORMAL HIGH (ref 70–99)
Potassium: 3.5 mmol/L (ref 3.5–5.1)
Sodium: 139 mmol/L (ref 135–145)
Total Bilirubin: 0.9 mg/dL (ref 0.0–1.2)
Total Protein: 7 g/dL (ref 6.5–8.1)

## 2023-07-19 LAB — CBC WITH DIFFERENTIAL/PLATELET
Abs Immature Granulocytes: 0.07 10*3/uL (ref 0.00–0.07)
Basophils Absolute: 0 10*3/uL (ref 0.0–0.1)
Basophils Relative: 0 %
Eosinophils Absolute: 0.1 10*3/uL (ref 0.0–0.5)
Eosinophils Relative: 1 %
HCT: 35.8 % — ABNORMAL LOW (ref 36.0–46.0)
Hemoglobin: 10.5 g/dL — ABNORMAL LOW (ref 12.0–15.0)
Immature Granulocytes: 1 %
Lymphocytes Relative: 2 %
Lymphs Abs: 0.3 10*3/uL — ABNORMAL LOW (ref 0.7–4.0)
MCH: 21.3 pg — ABNORMAL LOW (ref 26.0–34.0)
MCHC: 29.3 g/dL — ABNORMAL LOW (ref 30.0–36.0)
MCV: 72.6 fL — ABNORMAL LOW (ref 80.0–100.0)
Monocytes Absolute: 0.5 10*3/uL (ref 0.1–1.0)
Monocytes Relative: 4 %
Neutro Abs: 11.3 10*3/uL — ABNORMAL HIGH (ref 1.7–7.7)
Neutrophils Relative %: 92 %
Platelets: 328 10*3/uL (ref 150–400)
RBC: 4.93 MIL/uL (ref 3.87–5.11)
RDW: 14 % (ref 11.5–15.5)
WBC: 12.3 10*3/uL — ABNORMAL HIGH (ref 4.0–10.5)
nRBC: 0 % (ref 0.0–0.2)

## 2023-07-19 LAB — LIPASE, BLOOD: Lipase: 32 U/L (ref 11–51)

## 2023-07-19 MED ORDER — SODIUM CHLORIDE 0.9 % IV BOLUS
1000.0000 mL | Freq: Once | INTRAVENOUS | Status: AC
  Administered 2023-07-19: 1000 mL via INTRAVENOUS

## 2023-07-19 MED ORDER — LOPERAMIDE HCL 2 MG PO CAPS
4.0000 mg | ORAL_CAPSULE | Freq: Once | ORAL | Status: AC
  Administered 2023-07-19: 4 mg via ORAL
  Filled 2023-07-19: qty 2

## 2023-07-19 MED ORDER — ONDANSETRON 4 MG PO TBDP: 4.0000 mg | ORAL_TABLET | Freq: Three times a day (TID) | ORAL | 0 refills | Status: AC | PRN

## 2023-07-19 NOTE — ED Notes (Signed)
Pt attempted to obtain a urine sample. Was not successful.

## 2023-07-19 NOTE — Discharge Instructions (Addendum)
Take loperamide (Imodium A-D) as needed for diarrhea. ?

## 2023-07-19 NOTE — ED Provider Notes (Signed)
[place] EMERGENCY DEPARTMENT AT [facility] Provider Note   CSN: [id number] Arrival date & time: 07/19/23  0343     History  Chief Complaint  Patient presents with   Emesis   Diarrhea    Linda Carroll is a 48 y.o. female.  The history is provided by the patient.  Emesis Associated symptoms: diarrhea   Diarrhea Associated symptoms: vomiting   She had onset at midnight of nausea, vomiting, diarrhea.  She has vomited numerous times and had about 4 watery bowel movements.  She denies abdominal pain, fever, chills, sweats.  She denies any sick contacts and denies any suspicious food intake.  She arrived by ambulance.  EMS gave her ondansetron which has helped ease her nausea.   Home Medications Prior to Admission medications   Medication Sig Start Date End Date Taking? Authorizing Provider  albuterol (VENTOLIN HFA) 108 (90 Base) MCG/ACT inhaler Inhale 1-2 puffs into the lungs every 6 (six) hours as needed for wheezing or shortness of breath. 06/10/20   Merrilee Jansky, MD  budesonide-formoterol (SYMBICORT) 80-4.5 MCG/ACT inhaler Inhale 2 puffs into the lungs 2 (two) times daily. 06/10/20 07/10/20  Merrilee Jansky, MD  montelukast (SINGULAIR) 10 MG tablet Take 1 tablet (10 mg total) by mouth at bedtime. 06/10/20   Lamptey, Britta Mccreedy, MD      Allergies    Peanuts [peanut oil]    Review of Systems   Review of Systems  Gastrointestinal:  Positive for diarrhea and vomiting.  All other systems reviewed and are negative.   Physical Exam Updated Vital Signs BP 124/70   Pulse (!) 102   Temp 98.1 F (36.7 C) (Oral)   Resp 18   Ht 5\' 3"  (1.6 m)   Wt 93 kg   LMP 07/12/2023 (Exact Date)   SpO2 98%   BMI 36.32 kg/m  Physical Exam Vitals and nursing note reviewed.   48 year old female, resting comfortably and in no acute distress. Vital signs are significant for borderline elevated heart rate. Oxygen saturation is 98%, which is normal. Head is normocephalic and  atraumatic. PERRLA, EOMI. Oropharynx is clear. Neck is nontender and supple without adenopathy. Lungs are clear without rales, wheezes, or rhonchi. Chest is nontender. Heart has regular rate and rhythm without murmur. Abdomen is soft, flat, nontender. Extremities have no cyanosis or edema, full range of motion is present. Skin is warm and dry without rash. Neurologic: Mental status is normal, cranial nerves are intact, moves all extremities equally.  ED Results / Procedures / Treatments   Labs (all labs ordered are listed, but only abnormal results are displayed) Labs Reviewed  COMPREHENSIVE METABOLIC PANEL - Abnormal; Notable for the following components:      Result Value   Glucose, Bld 129 (*)    Calcium 8.4 (*)    Albumin 3.4 (*)    All other components within normal limits  CBC WITH DIFFERENTIAL/PLATELET - Abnormal; Notable for the following components:   WBC 12.3 (*)    Hemoglobin 10.5 (*)    HCT 35.8 (*)    MCV 72.6 (*)    MCH 21.3 (*)    MCHC 29.3 (*)    Neutro Abs 11.3 (*)    Lymphs Abs 0.3 (*)    All other components within normal limits  LIPASE, BLOOD  POC URINE PREG, ED   Procedures Procedures    Medications Ordered in ED Medications  sodium chloride 0.9 % bolus 1,000 mL (has no administration in  time range)  loperamide (IMODIUM) capsule 4 mg (has no administration in time range)    ED Course/ Medical Decision Making/ A&P                                 Medical Decision Making Amount and/or Complexity of Data Reviewed Labs: ordered.  Risk Prescription drug management.   Nausea, vomiting, diarrhea and pattern strongly suggestive of viral gastroenteritis, consider food poisoning.  Doubt bowel obstruction, diverticulitis or other serious pathology.  I have ordered IV fluids, loperamide for diarrhea as well as screening labs of CBC, comprehensive metabolic panel, lipase.  I have reviewed her laboratory tests, and my interpretation is elevation of  random glucose which will need to be followed as an outpatient, mild leukocytosis which is nonspecific, stable anemia.  She feels significantly better following above-noted treatment, has been able to hold down liquids.  I feel she is safe for discharge.  I am discharging her with a prescription for ondansetron oral dissolving tablet, told to use over-the-counter loperamide as needed for diarrhea.  Final Clinical Impression(s) / ED Diagnoses Final diagnoses:  Nausea vomiting and diarrhea  Elevated random blood glucose level  Microcytic anemia    Rx / DC Orders ED Discharge Orders          Ordered    ondansetron (ZOFRAN-ODT) 4 MG disintegrating tablet  Every 8 hours PRN        07/19/23 0658              Dione Booze, MD 07/19/23 0700

## 2023-07-19 NOTE — ED Triage Notes (Signed)
Pt with N/V/D since midnight. Pt given Zofran 4mg  IV en route by EMS.
